# Patient Record
Sex: Female | Born: 1975 | Race: White | Hispanic: No | Smoking: Former smoker
Health system: Southern US, Community
[De-identification: ages and names within clinical notes are randomized; demographics above are authoritative.]

## PROBLEM LIST (undated history)

## (undated) DIAGNOSIS — F419 Anxiety disorder, unspecified: Secondary | ICD-10-CM

## (undated) DIAGNOSIS — E079 Disorder of thyroid, unspecified: Secondary | ICD-10-CM

## (undated) DIAGNOSIS — K219 Gastro-esophageal reflux disease without esophagitis: Secondary | ICD-10-CM

## (undated) DIAGNOSIS — E039 Hypothyroidism, unspecified: Secondary | ICD-10-CM

## (undated) HISTORY — DX: Gastro-esophageal reflux disease without esophagitis: K21.9

## (undated) HISTORY — PX: CHOLECYSTECTOMY: SHX55

## (undated) HISTORY — DX: Disorder of thyroid, unspecified: E07.9

---

## 1999-02-23 ENCOUNTER — Inpatient Hospital Stay (HOSPITAL_COMMUNITY): Admission: AD | Admit: 1999-02-23 | Discharge: 1999-02-23 | Payer: Self-pay | Admitting: *Deleted

## 1999-03-05 ENCOUNTER — Ambulatory Visit (HOSPITAL_COMMUNITY): Admission: RE | Admit: 1999-03-05 | Discharge: 1999-03-05 | Payer: Self-pay | Admitting: Obstetrics and Gynecology

## 1999-03-05 ENCOUNTER — Encounter: Payer: Self-pay | Admitting: Obstetrics and Gynecology

## 1999-04-04 ENCOUNTER — Other Ambulatory Visit: Admission: RE | Admit: 1999-04-04 | Discharge: 1999-04-04 | Payer: Self-pay | Admitting: *Deleted

## 1999-08-06 ENCOUNTER — Inpatient Hospital Stay (HOSPITAL_COMMUNITY): Admission: AD | Admit: 1999-08-06 | Discharge: 1999-08-06 | Payer: Self-pay | Admitting: *Deleted

## 1999-10-27 ENCOUNTER — Encounter (INDEPENDENT_AMBULATORY_CARE_PROVIDER_SITE_OTHER): Payer: Self-pay | Admitting: Specialist

## 1999-10-27 ENCOUNTER — Inpatient Hospital Stay (HOSPITAL_COMMUNITY): Admission: AD | Admit: 1999-10-27 | Discharge: 1999-10-29 | Payer: Self-pay | Admitting: Obstetrics & Gynecology

## 1999-10-30 ENCOUNTER — Encounter: Admission: RE | Admit: 1999-10-30 | Discharge: 2000-01-28 | Payer: Self-pay | Admitting: *Deleted

## 2000-01-30 ENCOUNTER — Encounter: Admission: RE | Admit: 2000-01-30 | Discharge: 2000-04-29 | Payer: Self-pay | Admitting: *Deleted

## 2000-03-13 ENCOUNTER — Other Ambulatory Visit: Admission: RE | Admit: 2000-03-13 | Discharge: 2000-03-13 | Payer: Self-pay | Admitting: *Deleted

## 2000-05-29 ENCOUNTER — Encounter: Admission: RE | Admit: 2000-05-29 | Discharge: 2000-07-08 | Payer: Self-pay | Admitting: Obstetrics and Gynecology

## 2001-03-23 ENCOUNTER — Other Ambulatory Visit: Admission: RE | Admit: 2001-03-23 | Discharge: 2001-03-23 | Payer: Self-pay | Admitting: Obstetrics and Gynecology

## 2001-05-05 ENCOUNTER — Encounter: Admission: RE | Admit: 2001-05-05 | Discharge: 2001-05-05 | Payer: Self-pay | Admitting: Orthopedic Surgery

## 2001-05-05 ENCOUNTER — Encounter: Payer: Self-pay | Admitting: Orthopedic Surgery

## 2002-04-05 ENCOUNTER — Other Ambulatory Visit: Admission: RE | Admit: 2002-04-05 | Discharge: 2002-04-05 | Payer: Self-pay | Admitting: Obstetrics and Gynecology

## 2002-08-20 ENCOUNTER — Emergency Department (HOSPITAL_COMMUNITY): Admission: EM | Admit: 2002-08-20 | Discharge: 2002-08-20 | Payer: Self-pay | Admitting: Emergency Medicine

## 2003-03-01 ENCOUNTER — Emergency Department (HOSPITAL_COMMUNITY): Admission: EM | Admit: 2003-03-01 | Discharge: 2003-03-01 | Payer: Self-pay | Admitting: Emergency Medicine

## 2003-04-19 ENCOUNTER — Other Ambulatory Visit: Admission: RE | Admit: 2003-04-19 | Discharge: 2003-04-19 | Payer: Self-pay | Admitting: Obstetrics and Gynecology

## 2004-05-22 ENCOUNTER — Other Ambulatory Visit: Admission: RE | Admit: 2004-05-22 | Discharge: 2004-05-22 | Payer: Self-pay | Admitting: Obstetrics and Gynecology

## 2005-05-23 ENCOUNTER — Other Ambulatory Visit: Admission: RE | Admit: 2005-05-23 | Discharge: 2005-05-23 | Payer: Self-pay | Admitting: Obstetrics and Gynecology

## 2006-03-31 ENCOUNTER — Other Ambulatory Visit: Admission: RE | Admit: 2006-03-31 | Discharge: 2006-03-31 | Payer: Self-pay | Admitting: Obstetrics and Gynecology

## 2009-12-09 HISTORY — PX: TUBAL LIGATION: SHX77

## 2010-05-10 ENCOUNTER — Ambulatory Visit (HOSPITAL_COMMUNITY): Admission: RE | Admit: 2010-05-10 | Discharge: 2010-05-10 | Payer: Self-pay | Admitting: Obstetrics and Gynecology

## 2010-09-29 ENCOUNTER — Ambulatory Visit (HOSPITAL_COMMUNITY): Admission: RE | Admit: 2010-09-29 | Discharge: 2010-09-29 | Payer: Self-pay | Admitting: Obstetrics and Gynecology

## 2010-12-03 ENCOUNTER — Inpatient Hospital Stay (HOSPITAL_COMMUNITY)
Admission: AD | Admit: 2010-12-03 | Discharge: 2010-12-06 | Payer: Self-pay | Source: Home / Self Care | Attending: Obstetrics and Gynecology | Admitting: Obstetrics and Gynecology

## 2010-12-04 ENCOUNTER — Encounter (INDEPENDENT_AMBULATORY_CARE_PROVIDER_SITE_OTHER): Payer: Self-pay | Admitting: Obstetrics and Gynecology

## 2011-02-18 LAB — CBC
HCT: 32.8 % — ABNORMAL LOW (ref 36.0–46.0)
HCT: 39.2 % (ref 36.0–46.0)
Hemoglobin: 11 g/dL — ABNORMAL LOW (ref 12.0–15.0)
Hemoglobin: 13.8 g/dL (ref 12.0–15.0)
MCH: 31.2 pg (ref 26.0–34.0)
MCH: 31.6 pg (ref 26.0–34.0)
MCHC: 34.1 g/dL (ref 30.0–36.0)
MCHC: 35.2 g/dL (ref 30.0–36.0)
MCV: 89.7 fL (ref 78.0–100.0)
MCV: 91.4 fL (ref 78.0–100.0)
Platelets: 163 10*3/uL (ref 150–400)
Platelets: 228 10*3/uL (ref 150–400)
RBC: 3.59 MIL/uL — ABNORMAL LOW (ref 3.87–5.11)
RBC: 4.37 MIL/uL (ref 3.87–5.11)
RDW: 12.8 % (ref 11.5–15.5)
RDW: 13 % (ref 11.5–15.5)
WBC: 14.2 10*3/uL — ABNORMAL HIGH (ref 4.0–10.5)
WBC: 9.7 10*3/uL (ref 4.0–10.5)

## 2011-02-18 LAB — RH IMMUNE GLOB WKUP(>/=20WKS)(NOT WOMEN'S HOSP)
Fetal Screen: NEGATIVE
Unit division: 0

## 2011-02-18 LAB — RPR: RPR Ser Ql: NONREACTIVE

## 2011-02-20 LAB — RH IMMUNE GLOBULIN WORKUP (NOT WOMEN'S HOSP): Unit division: 0

## 2011-02-25 LAB — RH IMMUNE GLOBULIN WORKUP (NOT WOMEN'S HOSP)
ABO/RH(D): O NEG
Antibody Screen: NEGATIVE

## 2011-04-26 NOTE — H&P (Signed)
[facility]  Patient:    BATSHEVA STEVICK                       MRN: [id number] Adm. Date:  [id number] Attending:  Cleatrice Burke Dictator:   Erin Sons, C.N.M.                         History and Physical  HISTORY OF PRESENT ILLNESS:   Ms. Julien Girt is a 35 year old, gravida 2, para 0-0-1-0, at 39-6/7 weeks who presents with spontaneous rupture of membranes approximately 10:30 p.m. last night with clear fluid noted and regular contractions since that time.  Pregnancy has been remarkable for:  1) First trimester spotting. 2) Rh negative.  3) Pyelonephritis history.  4) Rh antibody screen secondary to  RhoGAM.  PRENATAL LABORATORY DATA:     Blood type is O negative, rh antibody titer was positive.  Toxoplasmosis titers were negative.  Syphilis test was negative, hepatitis test was negative, HIV test was negative, rubella titer was immune. C and Chlamydia cultures were negative.  Pap was normal.  Glucose Challenge was normal.  AFP was normal.  EDC of October 28, 1999, was established by last menstrual period and was in agreement with ultrasound at approximately six weeks. Group B Strep culture was negative at 36 weeks.  Hemoglobin upon entry into practice was 13.5.  It was 12.6 at 28 weeks.  HISTORY OF PRESENT PREGNANCY: The patient entered care at approximately 10 weeks. She had an ultrasound at 19 weeks which documented normal growth and EDC of October 28, 1999.  She did have RhoGAM in the first trimester secondary to an episode of spotting.  She had a UTI at 28 weeks which was treated with Macrobid. She received repeat RhoGAM at 28 weeks.  She did have some carpal tunnel during  this pregnancy.  She had bacterial vaginosis at 32 weeks and was treated for metronidazole.  She had some pink discharge after intercourse at 34 weeks, negative Beta Strep was noted.  She did have some blood in her stool that was probably related to an  internal hemorrhoid.  The rest of her pregnancy was uncomplicated.  PAST OBSTETRIC HISTORY:       In 1990 the patient had a therapeutic termination of pregnancy at 10-1/2 weeks without complications.  She did receive RhoGAM.  PAST MEDICAL HISTORY:         She was a previous condom user.  She was on birth  control pills, Ortho-Tricyclen approximately eight years ago.  She did have Chlamydia and Trichomonas in the past which were both treated.  She has a yeast  infection in the past.  She reports the usual childhood diseases.  She does have a pet cat.  She had pyelonephritis at age 75.  She had a UTI approximately seven years ago.  ALLERGIES:                    No known drug allergies.  FAMILY HISTORY:               Her paternal grandfather had heart disease.  Her mother had hepatitis.  Her mother also has fibromyalgia.  Her maternal grandmother had cervical cancer.  The patients father was addicted to alcohol.  GENETIC HISTORY:              Unremarkable.  SOCIAL HISTORY:  The patient is single.  The father of the baby is  involved and supportive.  His name is Bernadetta Roell.  She is Caucasian of the  Protestant faith.  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 Her husband has his GED and two years of college and is employed as a Financial risk analyst.  She has been followed by the certified nurse midwife service of [facility] and Gynecology.  She denies any alcohol, drug, or tobacco use during this pregnancy.  PHYSICAL EXAMINATION:  VITAL SIGNS:                  Stable.  The patient is afebrile.  HEENT:                        Within normal limits.  LUNGS:                        Bilateral breath sounds are clear.  HEART:                        Regular rate and rhythm without murmur.  BREASTS:                      Soft and nontender.  ABDOMEN:                      Fundal height is approximately 38 cm.  Estimated fetal weight 7-1/2 to 8  pounds.  Uterine contractions are every four to six minutes, mild to moderate quality.  PELVIC:                       Cervical examination was deferred at present. She was 2 cm and vertex earlier this week in the office.  She is leaking a moderate  amount of clear fluid.  Fetal heart rate is reactive with no decellerations.  EXTREMITIES:                  Deep tendon reflexes are 2+ without clonus. There is a trace edema noted.  IMPRESSION:                   1. Intrauterine pregnancy at 39-6/7 weeks.                               2. Spontaneous rupture of membranes.                               3. Early labor.                               4. Negative Group B Strep.  PLAN:                         1) Admit to birthing suite for consult with Cecilio Asper, M.D. as attending physician.  2) Routine certified nurse midwife  orders.  3) Reviewed option with patient and her husband for initiation of Pitocin augmentation now versus deferral of augmentation until approximately 12 hours after spontaneous rupture of membranes.  The patient and her husband currently wish to defer this at present and reevaluate approximately 12 to 38  hours after rupture. 4) Will plan Group B Strep prophylaxis beginning approximately 14 hours after spontaneous rupture of membranes. DD:  10/27/99 TD:  10/28/99 Job: [id number]

## 2012-03-03 ENCOUNTER — Encounter: Payer: Self-pay | Admitting: Obstetrics and Gynecology

## 2012-03-16 ENCOUNTER — Encounter (INDEPENDENT_AMBULATORY_CARE_PROVIDER_SITE_OTHER): Payer: 59 | Admitting: Obstetrics and Gynecology

## 2012-03-16 DIAGNOSIS — Z01419 Encounter for gynecological examination (general) (routine) without abnormal findings: Secondary | ICD-10-CM

## 2012-06-10 ENCOUNTER — Other Ambulatory Visit: Payer: Self-pay

## 2014-07-06 ENCOUNTER — Encounter: Payer: Self-pay | Admitting: Internal Medicine

## 2015-11-17 ENCOUNTER — Other Ambulatory Visit: Payer: Self-pay | Admitting: Podiatry

## 2015-11-20 ENCOUNTER — Other Ambulatory Visit: Payer: Self-pay | Admitting: Podiatry

## 2015-11-20 DIAGNOSIS — M76822 Posterior tibial tendinitis, left leg: Secondary | ICD-10-CM

## 2015-11-30 ENCOUNTER — Other Ambulatory Visit: Payer: Self-pay

## 2015-12-05 ENCOUNTER — Ambulatory Visit
Admission: RE | Admit: 2015-12-05 | Discharge: 2015-12-05 | Disposition: A | Discharge status: Home / Self Care | Payer: 59 | Source: Ambulatory Visit | Attending: Podiatry | Admitting: Podiatry

## 2015-12-05 DIAGNOSIS — M76822 Posterior tibial tendinitis, left leg: Secondary | ICD-10-CM

## 2015-12-05 MED ORDER — GADOBENATE DIMEGLUMINE 529 MG/ML IV SOLN
20.0000 mL | Freq: Once | INTRAVENOUS | Status: AC | PRN
  Administered 2015-12-05: 20 mL via INTRAVENOUS

## 2017-04-23 ENCOUNTER — Other Ambulatory Visit: Payer: Self-pay | Admitting: Obstetrics and Gynecology

## 2017-04-23 DIAGNOSIS — R928 Other abnormal and inconclusive findings on diagnostic imaging of breast: Secondary | ICD-10-CM

## 2017-04-25 ENCOUNTER — Ambulatory Visit
Admission: RE | Admit: 2017-04-25 | Discharge: 2017-04-25 | Disposition: A | Discharge status: Home / Self Care | Payer: 59 | Source: Ambulatory Visit | Attending: Obstetrics and Gynecology | Admitting: Obstetrics and Gynecology

## 2017-04-25 ENCOUNTER — Other Ambulatory Visit: Payer: Self-pay | Admitting: Obstetrics and Gynecology

## 2017-04-25 DIAGNOSIS — R928 Other abnormal and inconclusive findings on diagnostic imaging of breast: Secondary | ICD-10-CM

## 2017-11-27 ENCOUNTER — Other Ambulatory Visit: Payer: Self-pay | Admitting: Obstetrics and Gynecology

## 2017-11-27 DIAGNOSIS — R921 Mammographic calcification found on diagnostic imaging of breast: Secondary | ICD-10-CM

## 2017-12-23 ENCOUNTER — Ambulatory Visit
Admission: RE | Admit: 2017-12-23 | Discharge: 2017-12-23 | Disposition: A | Discharge status: Home / Self Care | Payer: 59 | Source: Ambulatory Visit | Attending: Obstetrics and Gynecology | Admitting: Obstetrics and Gynecology

## 2017-12-23 ENCOUNTER — Other Ambulatory Visit: Payer: Self-pay | Admitting: Obstetrics and Gynecology

## 2017-12-23 DIAGNOSIS — N6310 Unspecified lump in the right breast, unspecified quadrant: Secondary | ICD-10-CM

## 2017-12-23 DIAGNOSIS — R921 Mammographic calcification found on diagnostic imaging of breast: Secondary | ICD-10-CM

## 2017-12-29 ENCOUNTER — Ambulatory Visit
Admission: RE | Admit: 2017-12-29 | Discharge: 2017-12-29 | Disposition: A | Discharge status: Home / Self Care | Payer: 59 | Source: Ambulatory Visit | Attending: Obstetrics and Gynecology | Admitting: Obstetrics and Gynecology

## 2017-12-29 ENCOUNTER — Other Ambulatory Visit: Payer: Self-pay | Admitting: Obstetrics and Gynecology

## 2017-12-29 DIAGNOSIS — R921 Mammographic calcification found on diagnostic imaging of breast: Secondary | ICD-10-CM

## 2017-12-29 DIAGNOSIS — N6310 Unspecified lump in the right breast, unspecified quadrant: Secondary | ICD-10-CM

## 2018-07-17 ENCOUNTER — Other Ambulatory Visit: Payer: Self-pay | Admitting: Obstetrics and Gynecology

## 2018-07-17 DIAGNOSIS — R921 Mammographic calcification found on diagnostic imaging of breast: Secondary | ICD-10-CM

## 2018-08-06 ENCOUNTER — Encounter: Payer: Self-pay | Admitting: Physician Assistant

## 2018-08-06 ENCOUNTER — Encounter (INDEPENDENT_AMBULATORY_CARE_PROVIDER_SITE_OTHER): Payer: Self-pay

## 2018-08-06 ENCOUNTER — Other Ambulatory Visit (INDEPENDENT_AMBULATORY_CARE_PROVIDER_SITE_OTHER): Payer: 59

## 2018-08-06 ENCOUNTER — Ambulatory Visit (INDEPENDENT_AMBULATORY_CARE_PROVIDER_SITE_OTHER): Payer: 59 | Admitting: Physician Assistant

## 2018-08-06 VITALS — BP 100/78 | HR 86 | Ht 67.0 in | Wt 221.0 lb

## 2018-08-06 DIAGNOSIS — R112 Nausea with vomiting, unspecified: Secondary | ICD-10-CM

## 2018-08-06 DIAGNOSIS — R1010 Upper abdominal pain, unspecified: Secondary | ICD-10-CM | POA: Diagnosis not present

## 2018-08-06 DIAGNOSIS — K219 Gastro-esophageal reflux disease without esophagitis: Secondary | ICD-10-CM

## 2018-08-06 LAB — COMPREHENSIVE METABOLIC PANEL
ALK PHOS: 57 U/L (ref 39–117)
ALT: 15 U/L (ref 0–35)
AST: 12 U/L (ref 0–37)
Albumin: 4 g/dL (ref 3.5–5.2)
BUN: 12 mg/dL (ref 6–23)
CHLORIDE: 105 meq/L (ref 96–112)
CO2: 29 meq/L (ref 19–32)
Calcium: 9.1 mg/dL (ref 8.4–10.5)
Creatinine, Ser: 0.86 mg/dL (ref 0.40–1.20)
GFR: 76.51 mL/min (ref >60.00)
GLUCOSE: 108 mg/dL — AB (ref 70–99)
Potassium: 4.3 mEq/L (ref 3.5–5.1)
SODIUM: 139 meq/L (ref 135–145)
Total Bilirubin: 0.8 mg/dL (ref 0.2–1.2)
Total Protein: 6.7 g/dL (ref 6.0–8.3)

## 2018-08-06 LAB — CBC WITH DIFFERENTIAL/PLATELET
Basophils Absolute: 0.1 10*3/uL (ref 0.0–0.1)
Basophils Relative: 1.3 % (ref 0.0–3.0)
Eosinophils Absolute: 0.3 10*3/uL (ref 0.0–0.7)
Eosinophils Relative: 4 % (ref 0.0–5.0)
HCT: 34.2 % — ABNORMAL LOW (ref 36.0–46.0)
Hemoglobin: 12.1 g/dL (ref 12.0–15.0)
LYMPHS ABS: 1.9 10*3/uL (ref 0.7–4.0)
Lymphocytes Relative: 30.4 % (ref 12.0–46.0)
MCHC: 35.2 g/dL (ref 30.0–36.0)
MCV: 92.2 fl (ref 78.0–100.0)
MONOS PCT: 8.4 % (ref 3.0–12.0)
Monocytes Absolute: 0.5 10*3/uL (ref 0.1–1.0)
NEUTROS ABS: 3.5 10*3/uL (ref 1.4–7.7)
NEUTROS PCT: 55.9 % (ref 43.0–77.0)
Platelets: 225 10*3/uL (ref 150.0–400.0)
RBC: 3.71 Mil/uL — AB (ref 3.87–5.11)
RDW: 12.7 % (ref 11.5–15.5)
WBC: 6.3 10*3/uL (ref 4.0–10.5)

## 2018-08-06 LAB — LIPASE: LIPASE: 23 U/L (ref 11.0–59.0)

## 2018-08-06 MED ORDER — PANTOPRAZOLE SODIUM 40 MG PO TBEC: 40.0000 mg | DELAYED_RELEASE_TABLET | Freq: Two times a day (BID) | ORAL | 3 refills | Status: DC

## 2018-08-06 NOTE — Patient Instructions (Signed)
Your provider has requested that you go to the basement level for lab work before leaving today. Press "B" on the elevator. The lab is located at the first door on the left as you exit the elevator.  You have been scheduled for an abdominal ultrasound at [facility] (1st floor of hospital) on 08/13/18 at 10:30 am. Please arrive 15 minutes prior to your appointment for registration. Make certain not to have anything to eat or drink 6 hours prior to your appointment. Should you need to reschedule your appointment, please contact radiology at [phone]. This test typically takes about 30 minutes to perform.  We have sent the following medications to your pharmacy for you to pick up at your convenience: INCREASE Pantoprazole to 40 mg twice daily.  Thank you for choosing me and [facility].   Ellouise Newer, PA-C

## 2018-08-06 NOTE — Progress Notes (Signed)
Chief Complaint: GERD  HPI:    Mrs. Valerie Butler is a 42 year old female with a past medical history as listed below, who was referred to me by Jefm Petty, MD for a complaint of GERD .      10/06/2007 EGD with Dr. Henrene Pastor which showed patulous GE junction and was otherwise normal.    06/25/2018 saw PCP and discussed increase in reflux she was prescribed Protonix 40 mg daily at that time.    Today, explains that she is having the same sort of episodes that she was about 11 years ago when she was first seen in our clinic and had an EGD.  Tells me that over the past few months she has had episodes where she will eat something that "does not agree with me" such as greasy things or beer and about 2 hours later her stomach will feel as though it hardens and her "sphincter flip-flops" she will develop a severe chest pain and pain into her back that lasts 4 to 5 hours.  During this time the patient has violent vomiting and when she finally feels depleted of food the pain and flare will be over.  Was started on Pantoprazole 40 mg twice daily by her PCP a month and a half ago and this did "calm things down", but now she continues with reflux symptoms telling me that she tastes acid in her mouth on a daily basis.  She did back down to 1 a day Pantoprazole about a week or 2 ago.  Associated symptoms include some weight loss of about 5 to 10 pounds.    Denies fever, chills, anorexia, change in bowel habits or symptoms that awaken her from sleep.  Past Medical History:  Diagnosis Date  . GERD (gastroesophageal reflux disease)   . Thyroid disease     Surgical History: No previous surgeries  Current Outpatient Medications  Medication Sig Dispense Refill  . levothyroxine (SYNTHROID, LEVOTHROID) 112 MCG tablet Take 1 tablet by mouth daily.    . Multiple Vitamin (MULTIVITAMIN WITH MINERALS) TABS tablet Take 1 tablet by mouth daily.    . pantoprazole (PROTONIX) 40 MG tablet Take 1 tablet by mouth daily.     No  current facility-administered medications for this visit.     Allergies as of 08/06/2018  . (Not on File)    Family History  Problem Relation Age of Onset  . Rectal cancer Father   . Ovarian cancer Maternal Grandmother   . Colon cancer Neg Hx   . Esophageal cancer Neg Hx   . Pancreatic cancer Neg Hx     Social History   Socioeconomic History  . Marital status: Legally Separated    Spouse name: Not on file  . Number of children: 2  . Years of education: Not on file  . Highest education level: Not on file  Occupational History  . Occupation: Freight forwarder  Social Needs  . Financial resource strain: Not on file  . Food insecurity:    Worry: Not on file    Inability: Not on file  . Transportation needs:    Medical: Not on file    Non-medical: Not on file  Tobacco Use  . Smoking status: Former Research scientist (life sciences)  . Smokeless tobacco: Never Used  Substance and Sexual Activity  . Alcohol use: Yes    Alcohol/week: 1.0 standard drinks    Types: 1 Glasses of wine per week  . Drug use: Never  . Sexual activity: Not on file  Lifestyle  .  Physical activity:    Days per week: Not on file    Minutes per session: Not on file  . Stress: Not on file  Relationships  . Social connections:    Talks on phone: Not on file    Gets together: Not on file    Attends religious service: Not on file    Active member of club or organization: Not on file    Attends meetings of clubs or organizations: Not on file    Relationship status: Not on file  . Intimate partner violence:    Fear of current or ex partner: Not on file    Emotionally abused: Not on file    Physically abused: Not on file    Forced sexual activity: Not on file  Other Topics Concern  . Not on file  Social History Narrative  . Not on file    Review of Systems:    Constitutional: No weight loss, fever or chills Skin: No rash  Cardiovascular: No chest pain Respiratory: No SOB  Gastrointestinal: See HPI and otherwise  negative Genitourinary: No dysuria Neurological: No headache, dizziness or syncope Musculoskeletal: No new muscle or joint pain Hematologic: No bleeding  Psychiatric: No history of depression or anxiety   Physical Exam:  Vital signs: BP 100/78   Pulse 86   Ht 5\' 7"  (1.702 m)   Wt 221 lb (100.2 kg)   BMI 34.61 kg/m   Constitutional:   Pleasant overweight Caucasian female appears to be in NAD, Well developed, Well nourished, alert and cooperative Head:  Normocephalic and atraumatic. Eyes:   PEERL, EOMI. No icterus. Conjunctiva pink. Ears:  Normal auditory acuity. Neck:  Supple Throat: Oral cavity and pharynx without inflammation, swelling or lesion.  Respiratory: Respirations even and unlabored. Lungs clear to auscultation bilaterally.   No wheezes, crackles, or rhonchi.  Cardiovascular: Normal S1, S2. No MRG. Regular rate and rhythm. No peripheral edema, cyanosis or pallor.  Gastrointestinal:  Soft, nondistended, nontender. No rebound or guarding. Normal bowel sounds. No appreciable masses or hepatomegaly. Rectal:  Not performed.  Msk:  Symmetrical without gross deformities. Without edema, no deformity or joint abnormality.  Neurologic:  Alert and  oriented x4;  grossly normal neurologically.  Skin:   Dry and intact without significant lesions or rashes. Psychiatric: Demonstrates good judgement and reason without abnormal affect or behaviors.  No recent labs or imaging.  Assessment: 1.  Abdominal pain: Mostly epigastric pain which comes in flares about 2 hours after eating greasy food or drinking beer, EGD 11 years ago for the same symptoms was normal; consider gallbladder etiology +/- bile reflux versus gastric origin 2.  GERD: Daily, some better with Pantoprazole 40 mg 3.  Nausea and vomiting: With flares of abdominal pain; consider gallbladder origin  Plan: 1.  Scheduled patient for right upper quadrant ultrasound for further evaluation of her gallbladder.  If this is  normal/negative would recommend HIDA scan with CCK. 2.  Ordered labs to include CBC, CMP and lipase. 3.  Recommend patient increase her Pantoprazole to twice daily again 40 mg twice daily, 30-60 minutes for eating breakfast and dinner #60 with 3 refills 4.  Discussed with patient she continues with reflux symptoms or pain we could add Carafate, recommend Carafate 1 g 4 times daily 20 to 30 minutes before meals if needed. 5.  Patient to follow in clinic per recommendations after testing above.  Ellouise Newer, PA-C [facility] 08/06/2018, 9:57 AM  Cc: Jefm Petty, MD

## 2018-08-06 NOTE — Progress Notes (Signed)
Assessment and plan reviewed 

## 2018-08-07 ENCOUNTER — Ambulatory Visit
Admission: RE | Admit: 2018-08-07 | Discharge: 2018-08-07 | Disposition: A | Discharge status: Home / Self Care | Payer: 59 | Source: Ambulatory Visit | Attending: Obstetrics and Gynecology | Admitting: Obstetrics and Gynecology

## 2018-08-07 ENCOUNTER — Other Ambulatory Visit: Payer: Self-pay | Admitting: Obstetrics and Gynecology

## 2018-08-07 DIAGNOSIS — R921 Mammographic calcification found on diagnostic imaging of breast: Secondary | ICD-10-CM

## 2018-08-13 ENCOUNTER — Ambulatory Visit (HOSPITAL_COMMUNITY)
Admission: RE | Admit: 2018-08-13 | Discharge: 2018-08-13 | Disposition: A | Discharge status: Home / Self Care | Payer: 59 | Source: Ambulatory Visit | Attending: Physician Assistant | Admitting: Physician Assistant

## 2018-08-13 DIAGNOSIS — K8020 Calculus of gallbladder without cholecystitis without obstruction: Secondary | ICD-10-CM | POA: Insufficient documentation

## 2018-08-13 DIAGNOSIS — R1010 Upper abdominal pain, unspecified: Secondary | ICD-10-CM | POA: Insufficient documentation

## 2018-08-13 DIAGNOSIS — R112 Nausea with vomiting, unspecified: Secondary | ICD-10-CM | POA: Insufficient documentation

## 2018-08-18 ENCOUNTER — Telehealth: Payer: Self-pay | Admitting: Physician Assistant

## 2018-08-18 NOTE — Telephone Encounter (Signed)
I spoke with Valerie Butler at [facility] and she will call the pt and get her scheduled today.

## 2018-09-18 ENCOUNTER — Other Ambulatory Visit: Payer: Self-pay | Admitting: General Surgery

## 2020-01-13 ENCOUNTER — Other Ambulatory Visit: Payer: Self-pay | Admitting: Family Medicine

## 2020-01-13 DIAGNOSIS — Z1231 Encounter for screening mammogram for malignant neoplasm of breast: Secondary | ICD-10-CM

## 2020-01-19 ENCOUNTER — Other Ambulatory Visit: Payer: Self-pay

## 2020-01-19 ENCOUNTER — Ambulatory Visit
Admission: RE | Admit: 2020-01-19 | Discharge: 2020-01-19 | Disposition: A | Discharge status: Home / Self Care | Payer: BC Managed Care – PPO | Source: Ambulatory Visit | Attending: Family Medicine | Admitting: Family Medicine

## 2020-01-19 DIAGNOSIS — Z1231 Encounter for screening mammogram for malignant neoplasm of breast: Secondary | ICD-10-CM

## 2020-02-01 ENCOUNTER — Other Ambulatory Visit: Payer: Self-pay

## 2020-02-01 ENCOUNTER — Ambulatory Visit: Payer: BC Managed Care – PPO | Admitting: Neurology

## 2020-02-01 ENCOUNTER — Encounter: Payer: Self-pay | Admitting: Neurology

## 2020-02-01 VITALS — BP 112/82 | HR 62 | Temp 97.9°F | Ht 67.0 in | Wt 232.0 lb

## 2020-02-01 DIAGNOSIS — Z6836 Body mass index (BMI) 36.0-36.9, adult: Secondary | ICD-10-CM

## 2020-02-01 DIAGNOSIS — G478 Other sleep disorders: Secondary | ICD-10-CM

## 2020-02-01 DIAGNOSIS — E0789 Other specified disorders of thyroid: Secondary | ICD-10-CM | POA: Diagnosis not present

## 2020-02-01 DIAGNOSIS — E6609 Other obesity due to excess calories: Secondary | ICD-10-CM | POA: Diagnosis not present

## 2020-02-01 DIAGNOSIS — R5383 Other fatigue: Secondary | ICD-10-CM

## 2020-02-01 DIAGNOSIS — G4719 Other hypersomnia: Secondary | ICD-10-CM

## 2020-02-01 NOTE — Patient Instructions (Signed)

## 2020-02-01 NOTE — Progress Notes (Signed)
SLEEP MEDICINE CLINIC    Provider:  Larey Seat, MD  Primary Care Physician:  Hayden Rasmussen, [place] [address]     Referring Provider: Horald Pollen, MD       Chief Complaint according to patient   Patient presents with:    . New Patient (Initial Visit)     alone, rm 10. pt states that she has had difficulty with sleep for several years. she states she has made some sleep hygiene changes that certainly helped. never had a SS. pt states that she has woke herself up snoring and has woke up with headache. states this has been more so in past year- gained 20 pounds, has lost now again 15 pounds.       HISTORY OF PRESENT ILLNESS:  Valerie Butler is a 44 y.o. year old Caucasian female patient and seen here upon a referral by Dr. Horald Pollen on 02/01/2020   Chief concern according to patient :  "sleep is non- restorative, irregular periods ,and perimenopausal changes "  I have the pleasure of seeing Valerie Butler today, a right -handed Caucasian female with a long standing  sleep disorder.  She  has a past medical history of GERD (gastroesophageal reflux disease) and Thyroid disease. She had a cholecystectomy which helped GERD symptoms.  She developed more weight gain, and now has been gasping for breath in her sleep, and developed morning  headaches.     Sleep relevant medical history: Nocturia may be once, no Tonsillectomy, no cervical spine or ENT surgery , had thyroid disease treated with radioactive Iodine. She was a sleep walker.   Family medical /sleep history: Mother has non-restorative sleep and insomnia, but nobody sleep walked beside her.    Social history:  Patient is working in an Technical sales engineer, and lives in a household with 2 persons. Family status is divorced / separated 2 with children, a son (born 11) and daughter ( 2011) .The patient currently works days but sometimes into the night - Pets are  present, 2 dogs. Tobacco use: quit 2000.  ETOH use: 1-2 drinks a week,  Caffeine intake in form of Coffee( 2 cups in AM ) Soda( 1 at lunch ) Tea ( rarely) . NO  energy drinks. Regular exercise in form of walking-  And exercises at [facility] spin classes.   Hobbies : kayak, hiking.    Sleep habits are as follows: The patient's dinner time is between 7-8 PM. The patient goes to bed at 11-12 PM and continues to sleep for  hours, but feels that she didn't sleep- wakes up feeling alert- wakes rarely for bathroom breaks. Lots of pain issues affecting sleep, hip pain- at least 2 years, some low back discomfort. .   The preferred sleep position is laterally or prone, with the support of 1 pillow.  Dreams are reportedly rare- none in years.  6.30 AM is the usual rise time. The patient wakes up spontaneously at 5 AM- awake for 1.5 hours in bed.  She reports not feeling refreshed or restored in AM, but alert. Has sometimes symptoms such as dry mouth, but frequent morning headaches, and residual fatigue.  Naps are taken frequently in WEnds, lasting from 1 Pm to 3 Pm  and aren't  more refreshing than nocturnal sleep.    Review of Systems: Out of a complete 14 system review, the patient complains of only the following symptoms, and  all other reviewed systems are negative.:  Fatigue more than sleepiness , nasal congestion, new onset snoring, unfragmented sleep- but the feeling of subjective poor quality sleep , rather than Insomnia- the feeling she hasn't slept deeply at all.    How likely are you to doze in the following situations: 0 = not likely, 1 = slight chance, 2 = moderate chance, 3 = high chance   Sitting and Reading? Watching Television? Sitting inactive in a public place (theater or meeting)? As a passenger in a car for an hour without a break? Lying down in the afternoon when circumstances permit? Sitting and talking to someone? Sitting quietly after lunch without alcohol? In a car, while  stopped for a few minutes in traffic?   Total = 12/ 24 points   FSS endorsed at 45/ 63 points.   Social History   Socioeconomic History  . Marital status: Legally Separated    Spouse name: Not on file  . Number of children: 2  . Years of education: Not on file  . Highest education level: Not on file  Occupational History  . Occupation: Freight forwarder  Tobacco Use  . Smoking status: Former Smoker    Types: Cigarettes  . Smokeless tobacco: Never Used  . Tobacco comment: quit 20 yrs ago  Substance and Sexual Activity  . Alcohol use: Yes    Alcohol/week: 1.0 standard drinks    Types: 1 Glasses of wine per week  . Drug use: Never  . Sexual activity: Not on file  Other Topics Concern  . Not on file  Social History Narrative  . Not on file   Social Determinants of Health   Financial Resource Strain:   . Difficulty of Paying Living Expenses: Not on file  Food Insecurity:   . Worried About Charity fundraiser in the Last Year: Not on file  . Ran Out of Food in the Last Year: Not on file  Transportation Needs:   . Lack of Transportation (Medical): Not on file  . Lack of Transportation (Non-Medical): Not on file  Physical Activity:   . Days of Exercise per Week: Not on file  . Minutes of Exercise per Session: Not on file  Stress:   . Feeling of Stress : Not on file  Social Connections:   . Frequency of Communication with Friends and Family: Not on file  . Frequency of Social Gatherings with Friends and Family: Not on file  . Attends Religious Services: Not on file  . Active Member of Clubs or Organizations: Not on file  . Attends Archivist Meetings: Not on file  . Marital Status: Not on file    Family History  Problem Relation Age of Onset  . Rectal cancer Father   . Ovarian cancer Maternal Grandmother   . Colon cancer Neg Hx   . Esophageal cancer Neg Hx   . Pancreatic cancer Neg Hx     Past Medical History:  Diagnosis Date  . GERD (gastroesophageal reflux  disease)   . Thyroid disease     Past Surgical History:  Procedure Laterality Date  . CHOLECYSTECTOMY    . TUBAL LIGATION  2011     Current Outpatient Medications on File Prior to Visit  Medication Sig Dispense Refill  . levothyroxine (SYNTHROID, LEVOTHROID) 112 MCG tablet Take 1 tablet by mouth daily.    . Multiple Vitamin (MULTIVITAMIN WITH MINERALS) TABS tablet Take 1 tablet by mouth daily.    Marland Kitchen NUVARING 0.12-0.015 MG/24HR vaginal ring  INSERT 1 RING VAGINALLY FOR 3 WEEKS THEN REMOVE FOR 1 WEEK     No current facility-administered medications on file prior to visit.    Physical exam:  Today's Vitals   02/01/20 0848  BP: 112/82  Pulse: 62  Temp: 97.9 F (36.6 C)  Weight: 232 lb (105.2 kg)  Height: '5\' 7"'$  (1.702 m)   Body mass index is 36.34 kg/m.   Wt Readings from Last 3 Encounters:  02/01/20 232 lb (105.2 kg)  08/06/18 221 lb (100.2 kg)     Ht Readings from Last 3 Encounters:  02/01/20 '5\' 7"'$  (1.702 m)  08/06/18 '5\' 7"'$  (1.702 m)      General: The patient is awake, alert and appears not in acute distress.  The patient is well groomed. Head: Normocephalic, atraumatic.  Neck is supple. Mallampati 3- elongated uvula touching the tongue ground. ,  neck circumference: 15.6  inches .  Nasal airflow patent.   Retrognathia is not  seen.  Valerie Butler is noted -  Cardiovascular:  Regular rate and cardiac rhythm by pulse,  without distended neck veins. Respiratory: Lungs are clear to auscultation.  Skin:  Without evidence of ankle edema, or rash. Trunk: The patient's posture is erect.   Neurologic exam : The patient is awake and alert, oriented to place and time.   Memory subjective described as intact.  Attention span & concentration ability appears normal.  Speech is fluent,  without  dysarthria, dysphonia or aphasia.  Mood and affect are appropriate.   Cranial nerves: no loss of smell or taste reported  Pupils are equal and briskly reactive to light. Funduscopic exam  deferred.   Extraocular movements in vertical and horizontal planes were intact and without nystagmus. No Diplopia. Visual fields by finger perimetry are intact.Hearing was intact to soft voice and finger rubbing.   Facial sensation intact to fine touch. Facial motor strength is symmetric and tongue and uvula move midline.  Neck ROM : rotation, tilt and flexion extension were normal for age and shoulder shrug was symmetrical.    Motor exam:  Symmetric bulk, tone and ROM.  Normal tone without cog-wheeling, symmetric grip strength .   Sensory:  Fine touch, pinprick and vibration were tested  and  normal.  Proprioception tested in the upper extremities was normal.   Coordination: Rapid alternating movements in the fingers/hands were of normal speed.  The Finger-to-nose maneuver was intact without evidence of ataxia, dysmetria or tremor.   Gait and station: Patient could rise unassisted from a seated position, walked without assistive device.  Stance is of normal width/ base and the patient was observed turned with 3 steps.  Toe and heel walk were deferred.  Deep tendon reflexes: in the  upper and lower extremities are symmetric and intact.  Babinski response was deferred.       Summary.:  It showed Valerie Butler is a 44 year old mother of 2 who feels that she never reaches deep, restorative and refreshing sleep affecting memory and she feels as if she cannot concentrate or focus as well.  She is also noticed some delayed word finding also is not evident in our interview today her recent blood tests obtained through her primary care physician show normal white blood cell count red blood cell count hemoglobin hematocrit MCV platelets, there is no abnormal differential blood count comprehensive metabolic panel was normal sodium 140 potassium 5 chloride 107 mEq/L CO2 27 mEq/L glucose 95 fasting BUN 17 mg/dL, creatinine 8.92 letter normal range, calcium 9.6  protein overall 6.8 g/dL albumin 4.2 g, alk  phos 77 international units, ALT 16 international units AST 11 international units all speaking for good kidney and hepatic function GFR estimated to be 75 mL.  Hemoglobin A1c was 5.0 no indication of diabetes or prediabetes.  Her risk factors lie with her increase nasal congestion the feeling that she has to breathe through the mouth at occasion and that she probably started snoring within the last year or so.  Her BMI has increased also she is actively resuming an exercise regimen and has already reduced her body weight by 15 pounds again.    Onset of her sleep disturbance he was with her pregnancies.    After spending a total time of 45 minutes face to face and additional time for physical and neurologic examination, review of laboratory studies,  personal review of imaging studies, reports and results of other testing and review of referral information / records as far as provided in visit, I have established the following assessments:  1) subjective sleep perception is that of poor quality sleep 2) chronic persistent fatigue , before thyroid problems arose. 3) snoring -  4) high BMI  36.3   My Plan is to proceed with:  1) screen for OSA.  2) I would prefer a sleep architecture reflecting study as attended PSG to evaluate for  Sleep perception disorder.  3) sleep hygiene discussed, cold, quiet and dark bedroom, no electronics in the bedroom, hot shower before bed.  weighted blanket.  Insomnia boot camp instructions given.   I would like to thank Hayden Rasmussen, MD for allowing me to meet with and to take care of this pleasant patient.   In short, Valerie Butler is presenting with fatigue, onset of snoring and likely some sleep perception distortion. I plan to follow up either personally or through our NP within 2-3  month.   CC: I will share my notes with PCP.   Electronically signed by: Larey Seat, MD 02/01/2020 9:01 AM  [facility] [employer] Board certified by The [employer] of Sleep Medicine and Diplomate of the [employer] of Sleep Medicine. Board certified In Neurology through the [place], Fellow of the [employer] of Neurology. Medical Director of [employer].

## 2020-02-28 ENCOUNTER — Other Ambulatory Visit: Payer: Self-pay

## 2020-02-28 ENCOUNTER — Ambulatory Visit (INDEPENDENT_AMBULATORY_CARE_PROVIDER_SITE_OTHER): Payer: BC Managed Care – PPO | Admitting: Neurology

## 2020-02-28 DIAGNOSIS — E6609 Other obesity due to excess calories: Secondary | ICD-10-CM

## 2020-02-28 DIAGNOSIS — E0789 Other specified disorders of thyroid: Secondary | ICD-10-CM

## 2020-02-28 DIAGNOSIS — G478 Other sleep disorders: Secondary | ICD-10-CM

## 2020-02-28 DIAGNOSIS — R5383 Other fatigue: Secondary | ICD-10-CM

## 2020-02-28 DIAGNOSIS — G4710 Hypersomnia, unspecified: Secondary | ICD-10-CM

## 2020-03-01 ENCOUNTER — Telehealth: Payer: Self-pay

## 2020-03-01 NOTE — Telephone Encounter (Signed)
Called patient on 02-29-20 close to 4pm and left message her home sleep study needed top be returned by 5. It was due back on 02-29-20.                                                   03-01-20 called patient at 8am and left message that we needed machine back by lunch to clean and set up on another patient. No call back. Called her emergency contact at 1330 and explained the need for this to be turned back in. She was going to relay the message.

## 2020-03-14 DIAGNOSIS — E0789 Other specified disorders of thyroid: Secondary | ICD-10-CM | POA: Insufficient documentation

## 2020-03-14 DIAGNOSIS — G478 Other sleep disorders: Secondary | ICD-10-CM | POA: Insufficient documentation

## 2020-03-14 DIAGNOSIS — R5383 Other fatigue: Secondary | ICD-10-CM | POA: Insufficient documentation

## 2020-03-14 DIAGNOSIS — E6609 Other obesity due to excess calories: Secondary | ICD-10-CM | POA: Insufficient documentation

## 2020-03-14 DIAGNOSIS — Z6836 Body mass index (BMI) 36.0-36.9, adult: Secondary | ICD-10-CM | POA: Insufficient documentation

## 2020-03-14 NOTE — Progress Notes (Signed)
Summary & Diagnosis:   No sleep apnea was found - just mild snoring. No hypoxemia was  recorded.    Recommendations:    No explanation for morning headaches found. Snoring may respond  to weight loss, dental device and better sleep may be achieved  through HRT.   Interpreting Physician: Larey Seat, MD   Cc Dr Dian Situ, MD

## 2020-03-14 NOTE — Procedures (Signed)
  Patient Information     First Name: Valerie Last Name: Butler ID: [id number]  Birth Date: 1976/05/15 Age: 44 Gender: Female  Referring Provider: Horald Pollen, MD   BMI: 36.3 (W=231 lb, H=5' 7'')  Neck Circ.:  15 '' Epworth:  12/24   Sleep Study Information    Study Date: Mar 01, 2020 S/H/A Version: 001.001.001.001 / 4.0.1515 / 2  History:    Valerie Butler is a 44 y.o. year old Caucasian female patient and seen here upon a referral by Dr. Horald Pollen on 02/01/2020.Chief concern according to patient :  "sleep is non- restorative, also having irregular periods ,and perimenopausal changes "  Valerie Butler is a right -handed Caucasian female with a long standing  sleep disorder.  She has a past medical history of GERD (gastroesophageal reflux disease) and Thyroid disease. She had a cholecystectomy which helped GERD symptoms. She developed more weight gain, and now has been gasping for breath in her sleep, while she  developed morning headache.      Summary & Diagnosis:    No sleep apnea was found - just mild snoring. No hypoxemia was recorded.    Recommendations:     No explanation for morning headaches found. Snoring may respond to weight loss, dental device and better sleep may be achieved through HRT.   Interpreting Physician: Larey Seat, MD             Sleep Summary  Oxygen Saturation Statistics   Start Study Time: End Study Time: Total Recording Time:  9:05:25 PM 6:45:30 AM 9 h, 40 min  Total Sleep Time % REM of Sleep Time:  7 h, 51 min  21.3    Mean: 95 Minimum: 93 Maximum: 99  Mean of Desaturations Nadirs (%):   94  Oxygen Desaturation. %: 4-9 10-20 >20 Total  Events Number Total  2 100.0  0 0.0  0 0.0  2 100.0  Oxygen Saturation: <90 <=88 <85 <80 <70  Duration (minutes): Sleep % 0.0 0.0 0.0 0.0 0.0 0.0 0.0 0.0 0.0 0.0     Respiratory Indices      Total Events REM NREM All Night  pRDI:  99  pAHI:  15 ODI:  2  pAHIc:  0  % CSR: 0.0  16.5 6.1 1.2 0.0 11.7 0.8 0.0 0.0 12.7 1.9 0.3 0.0       Pulse Rate Statistics during Sleep (BPM)      Mean: 64 Minimum: 40 Maximum: 108    Indices are calculated using technically valid sleep time of 7 h, 47 min. Central-Indices are calculated using technically valid sleep time of 6 h, 56 min. pRDI/pAHI are calculated using oxi desaturations ? 3%  Body Position Statistics  Position Supine Prone Right Left Non-Supine  Sleep (min) 249.1 215.5 7.0 0.0 222.5  Sleep % 52.8 45.7 1.5 0.0 47.2  pRDI 13.9 10.0 N/A N/A 11.4  pAHI 1.5 1.7 N/A N/A 2.4  ODI 0.2 0.0 N/A N/A 0.3     Snoring Statistics Snoring Level (dB) >40 >50 >60 >70 >80 >Threshold (45)  Sleep (min) 197.1 8.5 2.2 0.2 0.0 16.7  Sleep % 41.8 1.8 0.5 0.0 0.0 3.5    Mean: 41 dB

## 2020-03-15 ENCOUNTER — Telehealth: Payer: Self-pay | Admitting: Neurology

## 2020-03-15 NOTE — Telephone Encounter (Signed)
-----   Message from Larey Seat, MD sent at 03/14/2020  4:05 PM EDT ----- Summary & Diagnosis:   No sleep apnea was found - just mild snoring. No hypoxemia was  recorded.    Recommendations:    No explanation for morning headaches found. Snoring may respond  to weight loss, dental device and better sleep may be achieved  through HRT.   Interpreting Physician: Larey Seat, MD   Cc Dr Dian Situ, MD

## 2020-03-15 NOTE — Telephone Encounter (Signed)
Called patient to discuss sleep study results. No answer at this time. LVM for the patient to call back.  Left a detailed message for the patient advising that there was no concerns of a sleep disorder present as well as no concerns of low oxygen. Advised the patient that if she had questions she could call back otherwise there was no need to follow up on the sleep clinic side.

## 2020-05-23 IMAGING — MG DIGITAL SCREENING BILAT W/ TOMO W/ CAD
8 series · 8 of 24 positions shown · non-contrast
Comparison: Previous exam(s).

CLINICAL DATA: Screening.

EXAM:
DIGITAL SCREENING BILATERAL MAMMOGRAM WITH TOMO AND CAD

[L CC synth-2D]
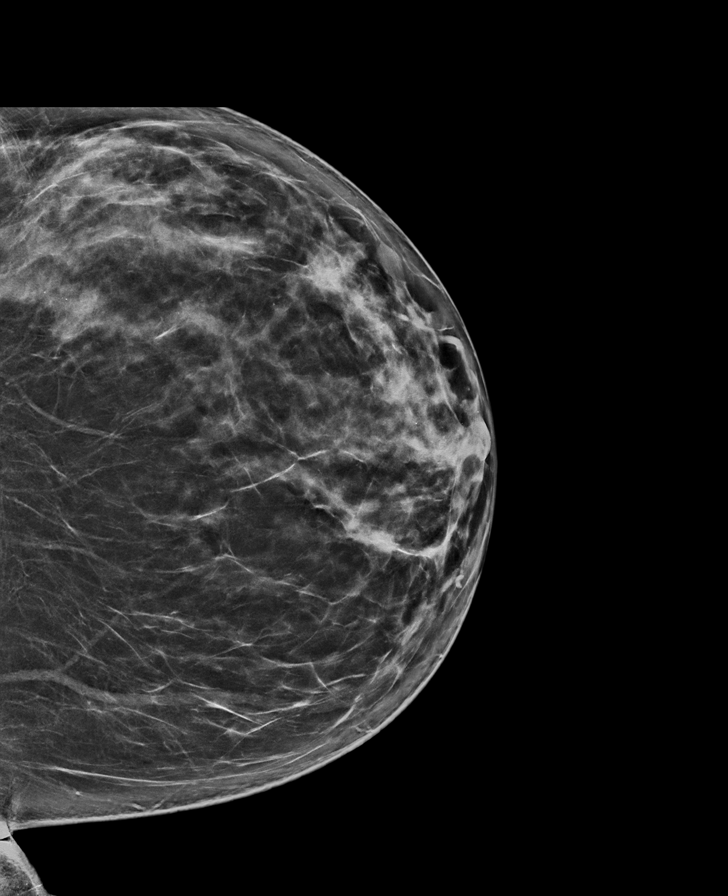

[L MLO synth-2D]
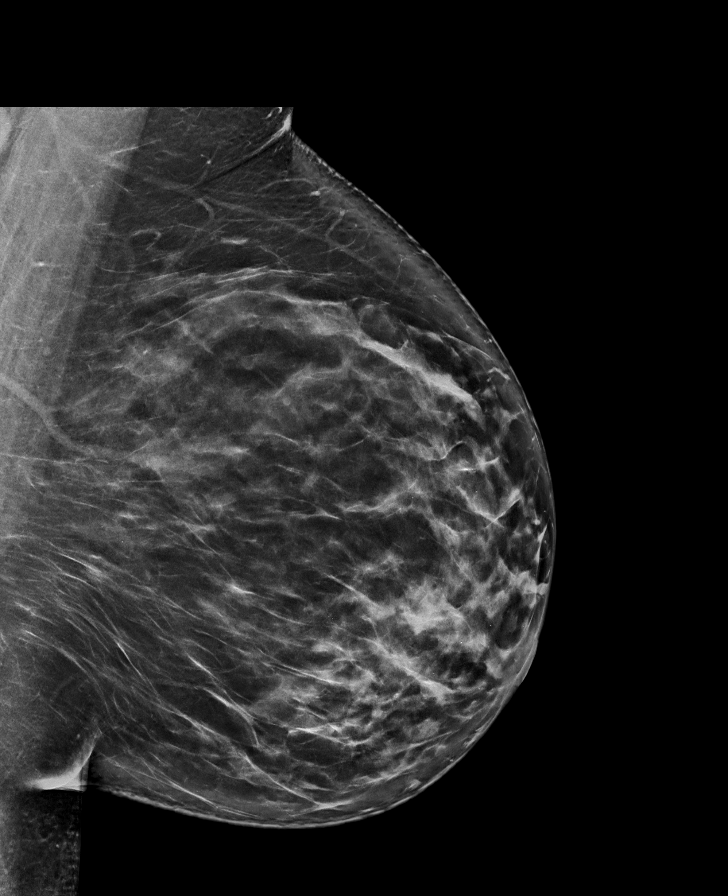

[R CC synth-2D]
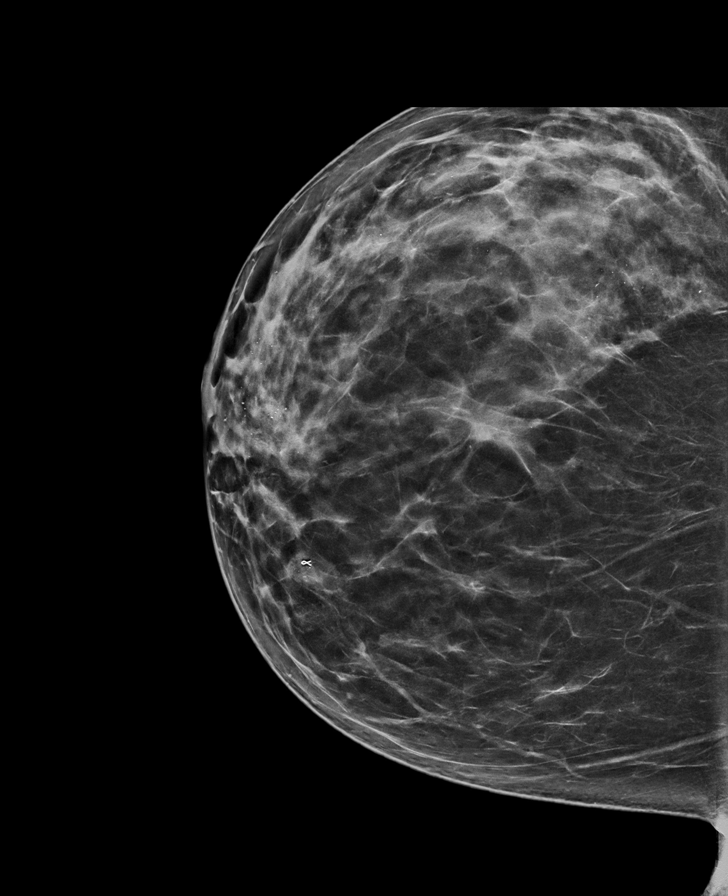

[R MLO synth-2D]
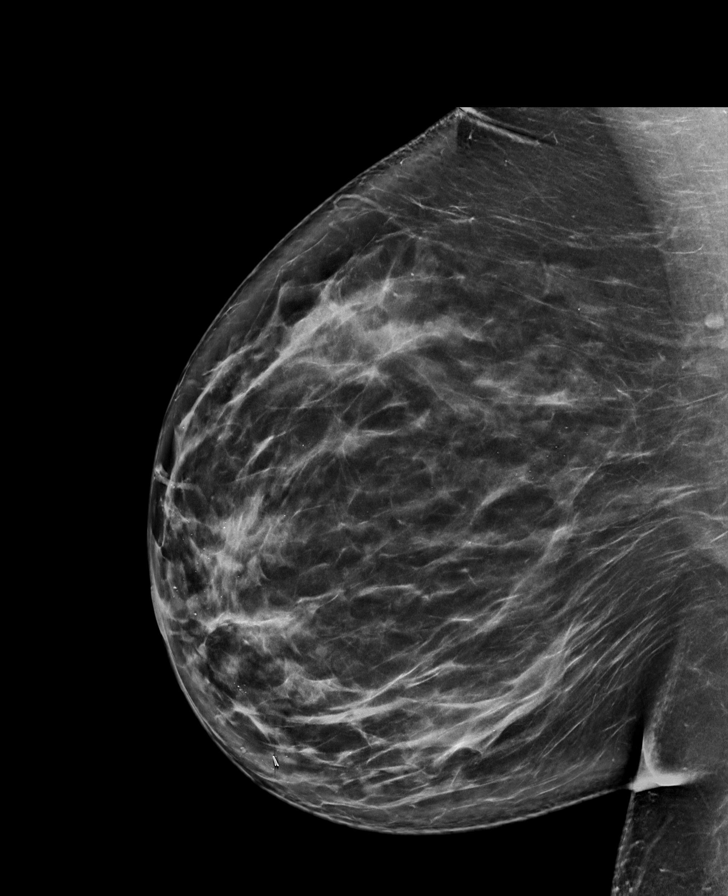

[L MLO tomo · tomo slice 49/96.0]
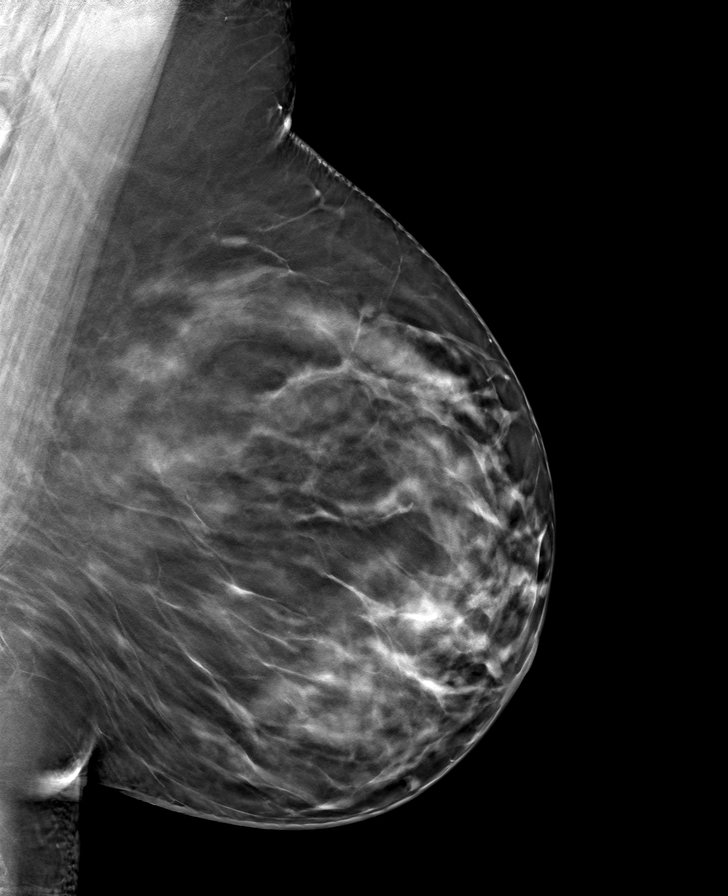

[L CC tomo · tomo slice 43/86.0]
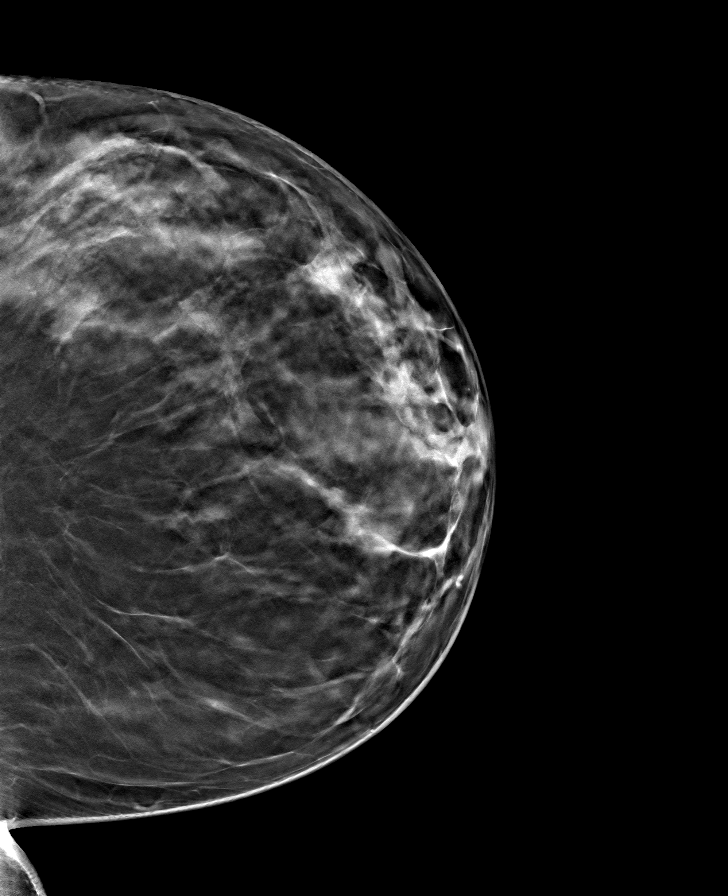

[R MLO tomo · tomo slice 50/99.0]
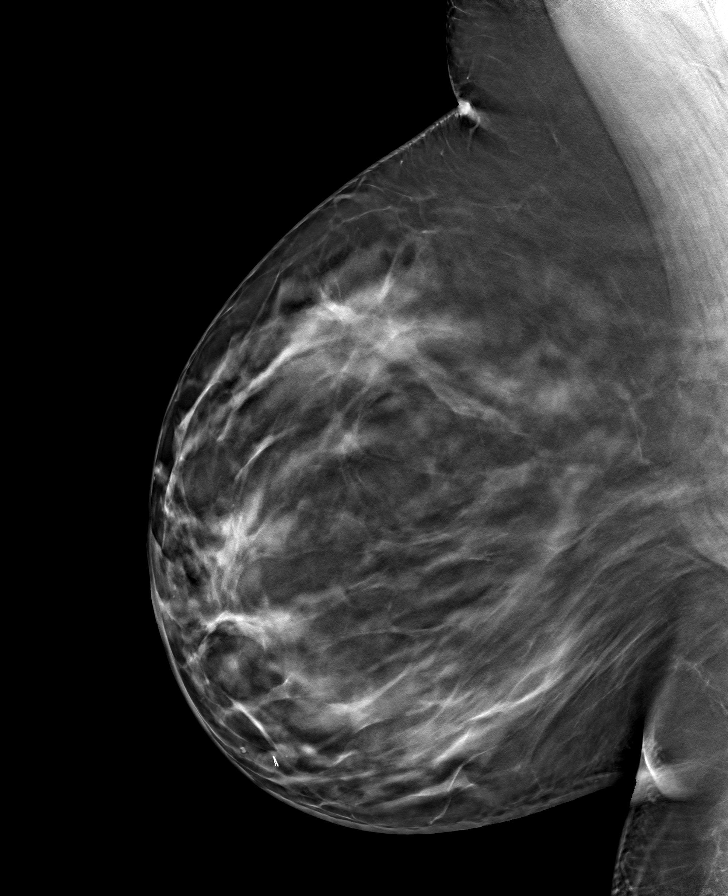

[R CC tomo · tomo slice 43/86.0]
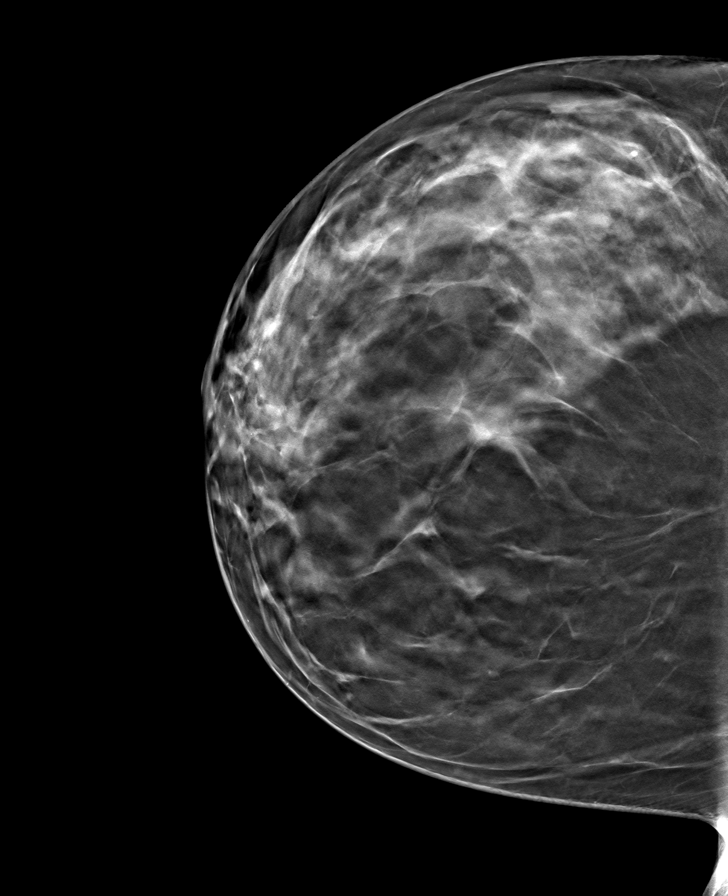

[8 of 24 positions shown; findings below may reference images not displayed]

ACR Breast Density Category c: The breast tissue is heterogeneously
dense, which may obscure small masses.
FINDINGS: There are no findings suspicious for malignancy. Images were
processed with CAD.
IMPRESSION: No mammographic evidence of malignancy. A result letter of this
screening mammogram will be mailed directly to the patient.

RECOMMENDATION:
Screening mammogram in one year. (Code:[id number]-[id number])

BI-RADS CATEGORY  1: Negative.

## 2021-08-10 ENCOUNTER — Encounter: Payer: Self-pay | Admitting: Internal Medicine

## 2021-10-26 ENCOUNTER — Ambulatory Visit (AMBULATORY_SURGERY_CENTER): Payer: BC Managed Care – PPO

## 2021-10-26 ENCOUNTER — Other Ambulatory Visit: Payer: Self-pay

## 2021-10-26 ENCOUNTER — Encounter: Payer: Self-pay | Admitting: Internal Medicine

## 2021-10-26 VITALS — Ht 67.0 in | Wt 230.0 lb

## 2021-10-26 DIAGNOSIS — Z1211 Encounter for screening for malignant neoplasm of colon: Secondary | ICD-10-CM

## 2021-10-26 MED ORDER — NA SULFATE-K SULFATE-MG SULF 17.5-3.13-1.6 GM/177ML PO SOLN: 1.0000 | Freq: Once | ORAL | 0 refills | Status: AC

## 2021-10-26 NOTE — Progress Notes (Signed)

## 2021-11-09 ENCOUNTER — Ambulatory Visit (AMBULATORY_SURGERY_CENTER): Payer: BC Managed Care – PPO | Admitting: Internal Medicine

## 2021-11-09 ENCOUNTER — Encounter: Payer: Self-pay | Admitting: Internal Medicine

## 2021-11-09 ENCOUNTER — Other Ambulatory Visit: Payer: Self-pay

## 2021-11-09 VITALS — BP 126/73 | HR 60 | Temp 98.9°F | Resp 13 | Ht 67.0 in | Wt 230.0 lb

## 2021-11-09 DIAGNOSIS — D127 Benign neoplasm of rectosigmoid junction: Secondary | ICD-10-CM

## 2021-11-09 DIAGNOSIS — Z1211 Encounter for screening for malignant neoplasm of colon: Secondary | ICD-10-CM | POA: Diagnosis not present

## 2021-11-09 DIAGNOSIS — D123 Benign neoplasm of transverse colon: Secondary | ICD-10-CM

## 2021-11-09 MED ORDER — SODIUM CHLORIDE 0.9 % IV SOLN: 500.0000 mL | Freq: Once | INTRAVENOUS | Status: DC

## 2021-11-09 NOTE — Progress Notes (Signed)
Pt's states no medical or surgical changes since previsit or office visit. VS assessed by C.W 

## 2021-11-09 NOTE — Progress Notes (Signed)
Called to room to assist during endoscopic procedure.  Patient ID and intended procedure confirmed with present staff. Received instructions for my participation in the procedure from the performing physician.  

## 2021-11-09 NOTE — Progress Notes (Signed)
HISTORY OF PRESENT ILLNESS:  Valerie Butler is a 45 y.o. female Valerie Butler today for routine screening colonoscopy.  REVIEW OF SYSTEMS:  All non-GI ROS negative. Past Medical History:  Diagnosis Date   GERD (gastroesophageal reflux disease)    Thyroid disease     Past Surgical History:  Procedure Laterality Date   CHOLECYSTECTOMY     TUBAL LIGATION  2011    Social History Valerie Butler  reports that she has quit smoking. Her smoking use included cigarettes. She has never used smokeless tobacco. She reports current alcohol use of about 1.0 standard drink per week. She reports that she does not use drugs.  family history includes Ovarian cancer in her maternal grandmother; Rectal cancer in her father.  No Known Allergies     PHYSICAL EXAMINATION:  Vital signs: BP 118/73   Pulse 63   Temp 98.9 F (37.2 C) (Skin)   Resp 13   Ht 5\' 7"  (1.702 m)   Wt 230 lb (104.3 kg)   SpO2 100%   BMI 36.02 kg/m  General: Well-developed, well-nourished, no acute distress HEENT: Sclerae are anicteric, conjunctiva pink. Oral mucosa intact Lungs: Clear Heart: Regular Abdomen: soft, nontender, nondistended, no obvious ascites, no peritoneal signs, normal bowel sounds. No organomegaly. Extremities: No edema Psychiatric: alert and oriented x3. Cooperative     ASSESSMENT:  1.  Colon cancer screening.  Average risk   PLAN:   1.  Screening colonoscopy

## 2021-11-09 NOTE — Op Note (Signed)
[facility] Patient Name: Valerie Butler Procedure Date: 11/09/2021 7:27 AM MRN: [id number] Endoscopist: Docia Chuck. Henrene Pastor , MD Age: 45 Referring MD:  Date of Birth: 09/01/1976 Gender: Female Account #: [id number] Procedure:                Colonoscopy with cold x 1 hot x 1 snare                            polypectomy; submucosal injection Indications:              Screening for colorectal malignant neoplasm Medicines:                Monitored Anesthesia Care Procedure:                Pre-Anesthesia Assessment:                           - Prior to the procedure, a History and Physical                            was performed, and patient medications and                            allergies were reviewed. The patient's tolerance of                            previous anesthesia was also reviewed. The risks                            and benefits of the procedure and the sedation                            options and risks were discussed with the patient.                            All questions were answered, and informed consent                            was obtained. Prior Anticoagulants: The patient has                            taken no previous anticoagulant or antiplatelet                            agents. ASA Grade Assessment: II - A patient with                            mild systemic disease. After reviewing the risks                            and benefits, the patient was deemed in                            satisfactory condition to undergo the procedure.  After obtaining informed consent, the colonoscope                            was passed under direct vision. Throughout the                            procedure, the patient's blood pressure, pulse, and                            oxygen saturations were monitored continuously. The                            Olympus CF-HQ190L (Serial# [id number]) Colonoscope was                             introduced through the anus and advanced to the the                            cecum, identified by appendiceal orifice and                            ileocecal valve. The ileocecal valve, appendiceal                            orifice, and rectum were photographed. The quality                            of the bowel preparation was excellent. The                            colonoscopy was performed without difficulty. The                            patient tolerated the procedure well. The bowel                            preparation used was SUPREP via split dose                            instruction. Scope In: 8:35:04 AM Scope Out: 8:54:46 AM Scope Withdrawal Time: 0 hours 16 minutes 37 seconds  Total Procedure Duration: 0 hours 19 minutes 42 seconds  Findings:                 A 3 mm polyp was found in the transverse colon. The                            polyp was removed with a cold snare. Resection and                            retrieval were complete.                           A 30 mm polyp was found in the recto-sigmoid colon                            (  20 cm from the anal verge). The polyp was                            pedunculated. The polyp was removed with a hot                            snare. Resection and retrieval were complete. Area                            was tattooed with an injection of 4 mL of Spot                            (carbon black).                           The exam was otherwise without abnormality on                            direct and retroflexion views. Complications:            No immediate complications. Estimated blood loss:                            None. Estimated Blood Loss:     Estimated blood loss: none. Impression:               - One 3 mm polyp in the transverse colon, removed                            with a cold snare. Resected and retrieved.                           - One 30 mm polyp at the recto-sigmoid colon,                             removed with a hot snare. Resected and retrieved.                            Tattooed.                           - The examination was otherwise normal on direct                            and retroflexion views. Recommendation:           - Repeat colonoscopy date to be determined after                            pending pathology results are reviewed for                            surveillance. If benign precancerous polyps, then 3  years.                           - Patient has a contact number available for                            emergencies. The signs and symptoms of potential                            delayed complications were discussed with the                            patient. Return to normal activities tomorrow.                            Written discharge instructions were provided to the                            patient.                           - Resume previous diet.                           - Continue present medications.                           - Await pathology results. Docia Chuck. Henrene Pastor, MD 11/09/2021 9:20:33 AM This report has been signed electronically.

## 2021-11-09 NOTE — Progress Notes (Signed)
Sedate, gd SR, tolerated procedure well, VSS, report to RN 

## 2021-11-09 NOTE — Patient Instructions (Signed)
Handout on polyps given. ° °YOU HAD AN ENDOSCOPIC PROCEDURE TODAY AT THE [facility]:   Refer to the procedure report that was given to you for any specific questions about what was found during the examination.  If the procedure report does not answer your questions, please call your gastroenterologist to clarify.  If you requested that your care partner not be given the details of your procedure findings, then the procedure report has been included in a sealed envelope for you to review at your convenience later. ° °YOU SHOULD EXPECT: Some feelings of bloating in the abdomen. Passage of more gas than usual.  Walking can help get rid of the air that was put into your GI tract during the procedure and reduce the bloating. If you had a lower endoscopy (such as a colonoscopy or flexible sigmoidoscopy) you may notice spotting of blood in your stool or on the toilet paper. If you underwent a bowel prep for your procedure, you may not have a normal bowel movement for a few days. ° °Please Note:  You might notice some irritation and congestion in your nose or some drainage.  This is from the oxygen used during your procedure.  There is no need for concern and it should clear up in a day or so. ° °SYMPTOMS TO REPORT IMMEDIATELY: ° °Following lower endoscopy (colonoscopy or flexible sigmoidoscopy): ° Excessive amounts of blood in the stool ° Significant tenderness or worsening of abdominal pains ° Swelling of the abdomen that is new, acute ° Fever of 100°F or higher ° °For urgent or emergent issues, a gastroenterologist can be reached at any hour by calling [phone]. °Do not use MyChart messaging for urgent concerns.  ° ° °DIET:  We do recommend a small meal at first, but then you may proceed to your regular diet.  Drink plenty of fluids but you should avoid alcoholic beverages for 24 hours. ° °ACTIVITY:  You should plan to take it easy for the rest of today and you should NOT DRIVE or use heavy machinery  until tomorrow (because of the sedation medicines used during the test).   ° °FOLLOW UP: °Our staff will call the number listed on your records 48-72 hours following your procedure to check on you and address any questions or concerns that you may have regarding the information given to you following your procedure. If we do not reach you, we will leave a message.  We will attempt to reach you two times.  During this call, we will ask if you have developed any symptoms of COVID 19. If you develop any symptoms (ie: fever, flu-like symptoms, shortness of breath, cough etc.) before then, please call [phone].  If you test positive for Covid 19 in the 2 weeks post procedure, please call and report this information to us.   ° °If any biopsies were taken you will be contacted by phone or by letter within the next 1-3 weeks.  Please call us at [phone] if you have not heard about the biopsies in 3 weeks.  ° ° °SIGNATURES/CONFIDENTIALITY: °You and/or your care partner have signed paperwork which will be entered into your electronic medical record.  These signatures attest to the fact that that the information above on your After Visit Summary has been reviewed and is understood.  Full responsibility of the confidentiality of this discharge information lies with you and/or your care-partner.  °

## 2021-11-13 ENCOUNTER — Telehealth: Payer: Self-pay

## 2021-11-13 NOTE — Telephone Encounter (Signed)
Patient returned call. States she is feeling great and returned to work

## 2021-11-13 NOTE — Telephone Encounter (Signed)
  Follow up Call-  Call back number 11/09/2021  Post procedure Call Back phone  # [phone]  Permission to leave phone message Yes  Some recent data might be hidden     F/u call attempted, no answer, left msg

## 2021-11-13 NOTE — Telephone Encounter (Signed)
Second post procedure follow up call, no answer 

## 2021-11-14 ENCOUNTER — Encounter: Payer: Self-pay | Admitting: Internal Medicine

## 2021-12-21 ENCOUNTER — Other Ambulatory Visit: Payer: Self-pay | Admitting: Family Medicine

## 2021-12-21 DIAGNOSIS — Z1231 Encounter for screening mammogram for malignant neoplasm of breast: Secondary | ICD-10-CM

## 2022-02-13 ENCOUNTER — Ambulatory Visit: Payer: BC Managed Care – PPO

## 2022-06-26 LAB — GLUCOSE, POCT (MANUAL RESULT ENTRY): POC Glucose: 118 mg/dl — AB (ref 70–99)

## 2023-08-27 ENCOUNTER — Other Ambulatory Visit: Payer: Self-pay | Admitting: Sports Medicine

## 2023-08-27 DIAGNOSIS — G8929 Other chronic pain: Secondary | ICD-10-CM

## 2023-08-27 DIAGNOSIS — M25511 Pain in right shoulder: Secondary | ICD-10-CM

## 2023-09-02 ENCOUNTER — Other Ambulatory Visit: Payer: Self-pay | Admitting: Sports Medicine

## 2023-09-04 ENCOUNTER — Other Ambulatory Visit: Payer: Self-pay | Admitting: Sports Medicine

## 2023-09-04 DIAGNOSIS — M25511 Pain in right shoulder: Secondary | ICD-10-CM

## 2023-10-10 ENCOUNTER — Ambulatory Visit
Admission: RE | Admit: 2023-10-10 | Discharge: 2023-10-10 | Disposition: A | Discharge status: Home / Self Care | Payer: BC Managed Care – PPO | Source: Ambulatory Visit | Attending: Sports Medicine | Admitting: Sports Medicine

## 2023-10-10 DIAGNOSIS — G8929 Other chronic pain: Secondary | ICD-10-CM

## 2023-10-10 DIAGNOSIS — M25511 Pain in right shoulder: Secondary | ICD-10-CM

## 2023-10-10 MED ORDER — IOPAMIDOL (ISOVUE-M 200) INJECTION 41%
10.0000 mL | Freq: Once | INTRAMUSCULAR | Status: AC
  Administered 2023-10-10: 10 mL via INTRA_ARTICULAR

## 2024-01-20 ENCOUNTER — Other Ambulatory Visit: Payer: Self-pay

## 2024-01-20 ENCOUNTER — Encounter (HOSPITAL_BASED_OUTPATIENT_CLINIC_OR_DEPARTMENT_OTHER): Payer: Self-pay | Admitting: Orthopedic Surgery

## 2024-01-20 NOTE — Progress Notes (Signed)

## 2024-01-26 NOTE — H&P (Signed)
 PREOPERATIVE H&P  Chief Complaint: Right shoulder pain  HPI: Valerie Butler is a 48 y.o. female who presents for preoperative history and physical with a diagnosis of right supraspinatus tear. Symptoms are rated as moderate to severe, and have been worsening.  This is significantly impairing activities of daily living.  She has elected for surgical management.  She has had pain for the last 6 to 8 months, failed injections anti-inflammatories exercises.  She does desk work.  Past Medical History:  Diagnosis Date   Anxiety    GERD (gastroesophageal reflux disease)    Hypothyroidism    Past Surgical History:  Procedure Laterality Date   CHOLECYSTECTOMY     TUBAL LIGATION  2011   Social History   Socioeconomic History   Marital status: Significant Other    Spouse name: Not on file   Number of children: 2   Years of education: Not on file   Highest education level: Not on file  Occupational History   Occupation: Production designer, theatre/television/film  Tobacco Use   Smoking status: Former    Types: Cigarettes   Smokeless tobacco: Never   Tobacco comments:    quit 20 yrs ago  Vaping Use   Vaping status: Never Used  Substance and Sexual Activity   Alcohol use: Not Currently    Alcohol/week: 1.0 standard drink of alcohol    Types: 1 Glasses of wine per week   Drug use: Never   Sexual activity: Not on file  Other Topics Concern   Not on file  Social History Narrative   Not on file   Social Drivers of Health   Financial Resource Strain: Not on file  Food Insecurity: Not on file  Transportation Needs: Not on file  Physical Activity: Not on file  Stress: Not on file  Social Connections: Unknown (07/01/2023)   Received from [facility]   Social Network    Social Network: Not on file   Family History  Problem Relation Age of Onset   Rectal cancer Father    Ovarian cancer Maternal Grandmother    Colon cancer Neg Hx    Esophageal cancer Neg Hx    Pancreatic cancer Neg Hx    Colon polyps Neg Hx     Stomach cancer Neg Hx    No Known Allergies Prior to Admission medications   Medication Sig Start Date End Date Taking? Authorizing Provider  levothyroxine (SYNTHROID) 125 MCG tablet Take 125 mcg by mouth daily before breakfast.   Yes [provider]  norgestimate-ethinyl estradiol (ORTHO-CYCLEN) 0.25-35 MG-MCG tablet Take 1 tablet by mouth daily.   Yes [provider]     Positive ROS: All other systems have been reviewed and were otherwise negative with the exception of those mentioned in the HPI and as above.  Physical Exam: General: Alert, no acute distress Cardiovascular: No pedal edema Respiratory: No cyanosis, no use of accessory musculature GI: No organomegaly, abdomen is soft and non-tender Skin: No lesions in the area of chief complaint Neurologic: Sensation intact distally Psychiatric: Patient is competent for consent with normal mood and affect Lymphatic: No axillary or cervical lymphadenopathy  MUSCULOSKELETAL: Right shoulder has 0 to 175 degrees with no pain over the [facility] joint, positive impingement signs and mid range weakness.  Assessment: Right shoulder supraspinatus tear   Plan: Plan for Procedure(s): SHOULDER ARTHROSCOPY WITH ROTATOR CUFF REPAIR AND SUBACROMIAL DECOMPRESSION ARTHROSCOPY SHOULDER  The risks benefits and alternatives were discussed with the patient including but not limited to the risks of  nonoperative treatment, versus surgical intervention including infection, bleeding, nerve injury,  blood clots, cardiopulmonary complications, morbidity, mortality, among others, and they were willing to proceed.  We also discussed risks for recurrent rotator cuff tear, incomplete relief of pain, among others.     Eulas Post, MD Cell [phone]   01/26/2024 4:30 PM

## 2024-01-27 ENCOUNTER — Encounter (HOSPITAL_BASED_OUTPATIENT_CLINIC_OR_DEPARTMENT_OTHER): Payer: Self-pay | Admitting: Orthopedic Surgery

## 2024-01-27 ENCOUNTER — Ambulatory Visit (HOSPITAL_BASED_OUTPATIENT_CLINIC_OR_DEPARTMENT_OTHER): Payer: BC Managed Care – PPO | Admitting: Anesthesiology

## 2024-01-27 ENCOUNTER — Encounter (HOSPITAL_BASED_OUTPATIENT_CLINIC_OR_DEPARTMENT_OTHER)
Admission: RE | Disposition: A | Discharge status: Home / Self Care | Payer: Self-pay | Source: Home / Self Care | Attending: Orthopedic Surgery

## 2024-01-27 ENCOUNTER — Ambulatory Visit (HOSPITAL_BASED_OUTPATIENT_CLINIC_OR_DEPARTMENT_OTHER)
Admission: RE | Admit: 2024-01-27 | Discharge: 2024-01-27 | Disposition: A | Discharge status: Home / Self Care | Payer: BC Managed Care – PPO | Attending: Orthopedic Surgery | Admitting: Orthopedic Surgery

## 2024-01-27 DIAGNOSIS — Z01818 Encounter for other preprocedural examination: Secondary | ICD-10-CM

## 2024-01-27 DIAGNOSIS — M25511 Pain in right shoulder: Secondary | ICD-10-CM | POA: Diagnosis present

## 2024-01-27 DIAGNOSIS — S43431A Superior glenoid labrum lesion of right shoulder, initial encounter: Secondary | ICD-10-CM | POA: Diagnosis not present

## 2024-01-27 DIAGNOSIS — K219 Gastro-esophageal reflux disease without esophagitis: Secondary | ICD-10-CM | POA: Insufficient documentation

## 2024-01-27 DIAGNOSIS — X58XXXA Exposure to other specified factors, initial encounter: Secondary | ICD-10-CM | POA: Insufficient documentation

## 2024-01-27 DIAGNOSIS — M25811 Other specified joint disorders, right shoulder: Secondary | ICD-10-CM | POA: Diagnosis not present

## 2024-01-27 DIAGNOSIS — M75121 Complete rotator cuff tear or rupture of right shoulder, not specified as traumatic: Secondary | ICD-10-CM | POA: Insufficient documentation

## 2024-01-27 DIAGNOSIS — Z87891 Personal history of nicotine dependence: Secondary | ICD-10-CM | POA: Insufficient documentation

## 2024-01-27 HISTORY — DX: Hypothyroidism, unspecified: E03.9

## 2024-01-27 HISTORY — PX: SHOULDER ARTHROSCOPY: SHX128

## 2024-01-27 HISTORY — DX: Anxiety disorder, unspecified: F41.9

## 2024-01-27 HISTORY — PX: SHOULDER ARTHROSCOPY WITH ROTATOR CUFF REPAIR AND SUBACROMIAL DECOMPRESSION: SHX5686

## 2024-01-27 LAB — POCT PREGNANCY, URINE: Preg Test, Ur: NEGATIVE

## 2024-01-27 SURGERY — SHOULDER ARTHROSCOPY WITH ROTATOR CUFF REPAIR AND SUBACROMIAL DECOMPRESSION
Anesthesia: General | Site: Shoulder | Laterality: Right

## 2024-01-27 MED ORDER — LIDOCAINE 2% (20 MG/ML) 5 ML SYRINGE
INTRAMUSCULAR | Status: AC
  Filled 2024-01-27: qty 5

## 2024-01-27 MED ORDER — FENTANYL CITRATE (PF) 100 MCG/2ML IJ SOLN
INTRAMUSCULAR | Status: AC
  Filled 2024-01-27: qty 2

## 2024-01-27 MED ORDER — ONDANSETRON HCL 4 MG/2ML IJ SOLN: 4.0000 mg | Freq: Four times a day (QID) | INTRAMUSCULAR | Status: DC | PRN

## 2024-01-27 MED ORDER — PROPOFOL 10 MG/ML IV BOLUS
INTRAVENOUS | Status: AC
  Filled 2024-01-27: qty 20

## 2024-01-27 MED ORDER — OXYCODONE HCL 5 MG PO TABS: 5.0000 mg | ORAL_TABLET | Freq: Once | ORAL | Status: DC | PRN

## 2024-01-27 MED ORDER — POVIDONE-IODINE 10 % EX SWAB
2.0000 | Freq: Once | CUTANEOUS | Status: DC
Start: 2024-01-27 — End: 2024-01-27

## 2024-01-27 MED ORDER — BUPIVACAINE HCL (PF) 0.5 % IJ SOLN
INTRAMUSCULAR | Status: DC | PRN
  Administered 2024-01-27: 15 mL via PERINEURAL

## 2024-01-27 MED ORDER — OXYCODONE HCL 5 MG PO TABS: 5.0000 mg | ORAL_TABLET | ORAL | 0 refills | Status: AC | PRN

## 2024-01-27 MED ORDER — ONDANSETRON HCL 4 MG PO TABS: 4.0000 mg | ORAL_TABLET | Freq: Three times a day (TID) | ORAL | 0 refills | Status: AC | PRN

## 2024-01-27 MED ORDER — FENTANYL CITRATE (PF) 100 MCG/2ML IJ SOLN: 25.0000 ug | INTRAMUSCULAR | Status: DC | PRN

## 2024-01-27 MED ORDER — DEXAMETHASONE SODIUM PHOSPHATE 10 MG/ML IJ SOLN
INTRAMUSCULAR | Status: AC
  Filled 2024-01-27: qty 1

## 2024-01-27 MED ORDER — FENTANYL CITRATE (PF) 100 MCG/2ML IJ SOLN
50.0000 ug | Freq: Once | INTRAMUSCULAR | Status: AC
  Administered 2024-01-27: 50 ug via INTRAVENOUS

## 2024-01-27 MED ORDER — CEFAZOLIN SODIUM-DEXTROSE 2-4 GM/100ML-% IV SOLN
INTRAVENOUS | Status: AC
  Filled 2024-01-27: qty 100

## 2024-01-27 MED ORDER — ACETAMINOPHEN 500 MG PO TABS
ORAL_TABLET | ORAL | Status: AC
  Filled 2024-01-27: qty 2

## 2024-01-27 MED ORDER — LACTATED RINGERS IV SOLN: INTRAVENOUS | Status: DC

## 2024-01-27 MED ORDER — ONDANSETRON HCL 4 MG/2ML IJ SOLN
INTRAMUSCULAR | Status: AC
  Filled 2024-01-27: qty 2

## 2024-01-27 MED ORDER — MIDAZOLAM HCL 2 MG/2ML IJ SOLN
INTRAMUSCULAR | Status: AC
  Filled 2024-01-27: qty 2

## 2024-01-27 MED ORDER — AMISULPRIDE (ANTIEMETIC) 5 MG/2ML IV SOLN
10.0000 mg | Freq: Once | INTRAVENOUS | Status: AC
  Administered 2024-01-27: 10 mg via INTRAVENOUS

## 2024-01-27 MED ORDER — ACETAMINOPHEN 500 MG PO TABS
1000.0000 mg | ORAL_TABLET | Freq: Once | ORAL | Status: AC
  Administered 2024-01-27: 1000 mg via ORAL

## 2024-01-27 MED ORDER — MIDAZOLAM HCL 2 MG/2ML IJ SOLN
1.0000 mg | Freq: Once | INTRAMUSCULAR | Status: AC
  Administered 2024-01-27: 1 mg via INTRAVENOUS

## 2024-01-27 MED ORDER — SUGAMMADEX SODIUM 200 MG/2ML IV SOLN
INTRAVENOUS | Status: DC | PRN
  Administered 2024-01-27: 200 mg via INTRAVENOUS

## 2024-01-27 MED ORDER — ROCURONIUM BROMIDE 10 MG/ML (PF) SYRINGE
PREFILLED_SYRINGE | INTRAVENOUS | Status: DC | PRN
  Administered 2024-01-27: 10 mg via INTRAVENOUS
  Administered 2024-01-27: 50 mg via INTRAVENOUS

## 2024-01-27 MED ORDER — DEXAMETHASONE SODIUM PHOSPHATE 10 MG/ML IJ SOLN
INTRAMUSCULAR | Status: DC | PRN
  Administered 2024-01-27: 5 mg via INTRAVENOUS

## 2024-01-27 MED ORDER — LIDOCAINE 2% (20 MG/ML) 5 ML SYRINGE
INTRAMUSCULAR | Status: DC | PRN
  Administered 2024-01-27: 60 mg via INTRAVENOUS

## 2024-01-27 MED ORDER — SENNA-DOCUSATE SODIUM 8.6-50 MG PO TABS: 2.0000 | ORAL_TABLET | Freq: Every day | ORAL | 1 refills | Status: AC

## 2024-01-27 MED ORDER — FENTANYL CITRATE (PF) 100 MCG/2ML IJ SOLN
INTRAMUSCULAR | Status: DC | PRN
  Administered 2024-01-27: 50 ug via INTRAVENOUS

## 2024-01-27 MED ORDER — BUPIVACAINE LIPOSOME 1.3 % IJ SUSP
INTRAMUSCULAR | Status: DC | PRN
Start: 2024-01-27 — End: 2024-01-27
  Administered 2024-01-27: 10 mL via PERINEURAL

## 2024-01-27 MED ORDER — AMISULPRIDE (ANTIEMETIC) 5 MG/2ML IV SOLN
INTRAVENOUS | Status: AC
  Filled 2024-01-27: qty 2

## 2024-01-27 MED ORDER — ONDANSETRON HCL 4 MG/2ML IJ SOLN
INTRAMUSCULAR | Status: DC | PRN
  Administered 2024-01-27: 4 mg via INTRAVENOUS

## 2024-01-27 MED ORDER — BACLOFEN 10 MG PO TABS
10.0000 mg | ORAL_TABLET | Freq: Three times a day (TID) | ORAL | 0 refills | Status: AC
Start: 2024-01-27 — End: ?

## 2024-01-27 MED ORDER — ROCURONIUM BROMIDE 10 MG/ML (PF) SYRINGE
PREFILLED_SYRINGE | INTRAVENOUS | Status: AC
  Filled 2024-01-27: qty 10

## 2024-01-27 MED ORDER — CEFAZOLIN SODIUM-DEXTROSE 2-4 GM/100ML-% IV SOLN
2.0000 g | INTRAVENOUS | Status: AC
  Administered 2024-01-27: 2 g via INTRAVENOUS

## 2024-01-27 MED ORDER — PROPOFOL 10 MG/ML IV BOLUS
INTRAVENOUS | Status: DC | PRN
  Administered 2024-01-27: 150 mg via INTRAVENOUS

## 2024-01-27 MED ORDER — OXYCODONE HCL 5 MG/5ML PO SOLN: 5.0000 mg | Freq: Once | ORAL | Status: DC | PRN

## 2024-01-27 SURGICAL SUPPLY — 49 items
ANCHOR SUT BIO SW 4.75X19.1 (Anchor) IMPLANT
BLADE EXCALIBUR 4.0X13 (MISCELLANEOUS) IMPLANT
BURR OVAL 8 FLU 4.0X13 (MISCELLANEOUS) ×1 IMPLANT
BURR OVAL 8 FLU 5.0X13 (MISCELLANEOUS) IMPLANT
CANNULA 5.75X71 LONG (CANNULA) ×1 IMPLANT
CANNULA TWIST IN 8.25X7CM (CANNULA) IMPLANT
CLSR STERI-STRIP ANTIMIC 1/2X4 (GAUZE/BANDAGES/DRESSINGS) ×1 IMPLANT
COOLER ICEMAN CLASSIC (MISCELLANEOUS) IMPLANT
DISSECTOR 3.8MM X 13CM (MISCELLANEOUS) ×1 IMPLANT
DRAPE IMP U-DRAPE 54X76 (DRAPES) ×1 IMPLANT
DRAPE INCISE IOBAN 66X45 STRL (DRAPES) ×1 IMPLANT
DRAPE POUCH INSTRU U-SHP 10X18 (DRAPES) ×1 IMPLANT
DRAPE SHOULDER BEACH CHAIR (DRAPES) ×1 IMPLANT
DRAPE SURG 17X23 STRL (DRAPES) ×1 IMPLANT
DRAPE U-SHAPE 47X51 STRL (DRAPES) ×1 IMPLANT
DURAPREP 26ML APPLICATOR (WOUND CARE) ×1 IMPLANT
FIBERSTICK 2 (SUTURE) IMPLANT
GAUZE PAD ABD 8X10 STRL (GAUZE/BANDAGES/DRESSINGS) ×1 IMPLANT
GAUZE SPONGE 4X4 12PLY STRL (GAUZE/BANDAGES/DRESSINGS) ×1 IMPLANT
GLOVE BIO SURGEON STRL SZ7 (GLOVE) ×1 IMPLANT
GLOVE BIOGEL PI IND STRL 7.0 (GLOVE) ×1 IMPLANT
GLOVE BIOGEL PI IND STRL 8 (GLOVE) ×2 IMPLANT
GLOVE ORTHO TXT STRL SZ7.5 (GLOVE) ×1 IMPLANT
GOWN STRL REUS W/ TWL LRG LVL3 (GOWN DISPOSABLE) ×2 IMPLANT
GOWN STRL REUS W/ TWL XL LVL3 (GOWN DISPOSABLE) ×2 IMPLANT
IV NS IRRIG 3000ML ARTHROMATIC (IV SOLUTION) ×3 IMPLANT
LASSO 90 CVE QUICKPAS (DISPOSABLE) IMPLANT
MANIFOLD NEPTUNE II (INSTRUMENTS) ×1 IMPLANT
NDL HD SCORPION MEGA LOADER (NEEDLE) IMPLANT
PACK ARTHROSCOPY DSU (CUSTOM PROCEDURE TRAY) ×1 IMPLANT
PACK BASIN DAY SURGERY FS (CUSTOM PROCEDURE TRAY) ×1 IMPLANT
RESTRAINT HEAD UNIVERSAL NS (MISCELLANEOUS) ×1 IMPLANT
SHEET MEDIUM DRAPE 40X70 STRL (DRAPES) ×1 IMPLANT
SLEEVE SCD COMPRESS KNEE MED (STOCKING) ×1 IMPLANT
SLING ARM FOAM STRAP LRG (SOFTGOODS) IMPLANT
SPIKE FLUID TRANSFER (MISCELLANEOUS) IMPLANT
SUPPORT WRAP ARM LG (MISCELLANEOUS) ×1 IMPLANT
SUT FIBERWIRE #2 38 T-5 BLUE (SUTURE) IMPLANT
SUT MNCRL AB 4-0 PS2 18 (SUTURE) ×1 IMPLANT
SUT PDS AB 1 CT 36 (SUTURE) IMPLANT
SUT TIGER TAPE 7 IN WHITE (SUTURE) IMPLANT
SUT VIC AB 3-0 SH 27X BRD (SUTURE) IMPLANT
SUTURE FIBERWR #2 38 T-5 BLUE (SUTURE) IMPLANT
TAPE FIBER 2MM 7IN #2 BLUE (SUTURE) IMPLANT
TOWEL GREEN STERILE FF (TOWEL DISPOSABLE) ×1 IMPLANT
TUBE CONNECTING 20X1/4 (TUBING) ×1 IMPLANT
TUBING ARTHROSCOPY IRRIG 16FT (MISCELLANEOUS) ×1 IMPLANT
WAND ABLATOR APOLLO I90 (BUR) ×1 IMPLANT
WATER STERILE IRR 1000ML POUR (IV SOLUTION) ×1 IMPLANT

## 2024-01-27 NOTE — Anesthesia Preprocedure Evaluation (Signed)
 Anesthesia Evaluation  Patient identified by MRN, date of birth, ID band Patient awake    Reviewed: Allergy & Precautions, H&P , NPO status , Patient's Chart, lab work & pertinent test results  Airway Mallampati: II   Neck ROM: full    Dental   Pulmonary former smoker   breath sounds clear to auscultation       Cardiovascular negative cardio ROS  Rhythm:regular Rate:Normal     Neuro/Psych    GI/Hepatic ,GERD  ,,  Endo/Other  Hypothyroidism    Renal/GU      Musculoskeletal   Abdominal   Peds  Hematology   Anesthesia Other Findings   Reproductive/Obstetrics                             Anesthesia Physical Anesthesia Plan  ASA: 2  Anesthesia Plan: General   Post-op Pain Management: Regional block*   Induction: Intravenous  PONV Risk Score and Plan: 3 and Ondansetron, Dexamethasone, Midazolam and Treatment may vary due to age or medical condition  Airway Management Planned: Oral ETT  Additional Equipment:   Intra-op Plan:   Post-operative Plan: Extubation in OR  Informed Consent: I have reviewed the patients History and Physical, chart, labs and discussed the procedure including the risks, benefits and alternatives for the proposed anesthesia with the patient or authorized representative who has indicated his/her understanding and acceptance.     Dental advisory given  Plan Discussed with: CRNA, Anesthesiologist and Surgeon  Anesthesia Plan Comments:        Anesthesia Quick Evaluation

## 2024-01-27 NOTE — Transfer of Care (Signed)
 Immediate Anesthesia Transfer of Care Note  Patient: Valerie Butler  Procedure(s) Performed: Procedure(s) (LRB): SHOULDER ARTHROSCOPY WITH ROTATOR CUFF REPAIR AND SUBACROMIAL DECOMPRESSION (Right) ARTHROSCOPY SHOULDER (Right)  Patient Location: PACU  Anesthesia Type: GA  Level of Consciousness: awake, sedated, patient cooperative and responds to stimulation, c/o pain in back - comfort measures given w/ medication   Airway & Oxygen Therapy: Patient Spontanous Breathing and Patient connected to [place] oxygen  Post-op Assessment: Report given to PACU RN, Post -op Vital signs reviewed and stable and Patient moving all extremities  Post vital signs: Reviewed and stable  Complications: No apparent anesthesia complications

## 2024-01-27 NOTE — Op Note (Signed)
 01/27/2024  11:31 AM  PATIENT:  Valerie Butler    DIAGNOSES: Right shoulder, acute on chronic rotator cuff tear, SLAP tear, and subacromial impingement.  POST-OPERATIVE DIAGNOSIS: same  PROCEDURE: Arthroscopic limited debridement - 16109 Arthroscopic subacromial decompression - 60454 Arthroscopic rotator cuff repair - 09811   OPERATIVE FINDING: Exam under anesthesia: Normal Articular space: Normal Chondral surfaces: Normal Biceps: Normal Subscapularis: Intact  Supraspinatus: Complete tear this tear had an interesting configuration, with basically a hold that was anterior just posterior to the biceps, and it was almost a tendon to tendon tear, most of the tendon was still attached to the bone. Infraspinatus: Intact   SURGEON:  Eulas Post, MD  PHYSICIAN ASSISTANT: Dan Humphreys, PA-C, present and scrubbed throughout the case, critical for completion in a timely fashion, and for retraction, instrumentation, and closure.  ANESTHESIA:   General with regional block  PREOPERATIVE INDICATIONS:  [name] is a  48 y.o. female with significant shoulder pain who failed conservative measures and elected for surgical management.    The risks benefits and alternatives were discussed with the patient preoperatively including but not limited to the risks of infection, bleeding, nerve injury, cardiopulmonary complications, the need for revision surgery, among others, and the patient was willing to proceed.  ESTIMATED BLOOD LOSS: Minimal  OPERATIVE IMPLANTS: Arthrex Biocomposite SwiveLock 4.75 x 2, 1 for the medial row, and I used the #2 FiberWire in a margin convergence type fashion bringing the tendon from side-to-side together and then tied down to the medial row, and then took all 4 remaining suture limbs, including 2 from the tie, and brought them into a lateral row.  I had 1 preloaded set of fiber tapes and the medial anchor.  I used a second swivel lock for the lateral  row.  OPERATIVE FINDINGS: She had full motion during examination under anesthesia.  She had a longitudinal tear in the supraspinatus at the anterior margin which was effectively a tendon to tendon tear.   OPERATIVE PROCEDURE: The patient was brought to the operating room and placed in the supine position.  General anesthesia was administered and the patient positioned in a beach chair position.  IV antibiotics were given.  The upper extremity was prepped and draped in the usual sterile fashion.  Timeout performed.  Diagnostic arthroscopy was carried out with the above named findings.    The glenoid labrum was debrided anteriorly and superiorly.    I debrided the undersurface of the cuff.  I went to the subacromial space, performed a complete bursectomy, subacromial CA ligament release, with a acromioplasty.  I evaluated the tear and prepared the tendon for reinsertion.  I cleared a space at the medial margin, and then performed a light tuboplasty, and then placed the medial anchor preloaded with fiber tape.  I passed the #2 FiberWire in the anterolateral and posterolateral position, and used a #2 fiber tape into the anteromedial and posterior medial positions.  I tied the #2 FiberWire together which had a nice margin convergence effect, and brought the repair to near completion, but then I augmented the fixation by taking all of the sutures into a lateral row taking care not to over tighten the fiber tapes.  Excellent fixation and reduction of the tendon was achieved.  I touched up the acromioplasty viewing from lateral portal.  The instruments were removed, the portals closed with Monocryl followed by Steri-Strips and sterile gauze.  The patient was awakened and returned to the PACU in stable and  satisfactory condition.  There were no complications and She tolerated the procedure well.

## 2024-01-27 NOTE — Anesthesia Procedure Notes (Signed)
 Anesthesia Regional Block: Interscalene brachial plexus block   Pre-Anesthetic Checklist: , timeout performed,  Correct Patient, Correct Site, Correct Laterality,  Correct Procedure, Correct Position, site marked,  Risks and benefits discussed,  Surgical consent,  Pre-op evaluation,  At surgeon's request and post-op pain management  Laterality: Right  Prep: chloraprep       Needles:  Injection technique: Single-shot  Needle Type: Echogenic Stimulator Needle     Needle Length: 5cm  Needle Gauge: 22     Additional Needles:   Procedures:, nerve stimulator,,,,,     Nerve Stimulator or Paresthesia:  Response: biceps flexion, 0.45 mA  Additional Responses:   Narrative:  Start time: 01/27/2024 9:20 AM End time: 01/27/2024 9:26 AM Injection made incrementally with aspirations every 5 mL.  Performed by: Personally  Anesthesiologist: Achille Rich, MD  Additional Notes: Functioning IV was confirmed and monitors were applied.  A 50mm 22ga Arrow echogenic stimulator needle was used. Sterile prep and drape,hand hygiene and sterile gloves were used.  Negative aspiration and negative test dose prior to incremental administration of local anesthetic. The patient tolerated the procedure well.  Ultrasound guidance: relevent anatomy identified, needle position confirmed, local anesthetic spread visualized around nerve(s), vascular puncture avoided.  Image printed for medical record.

## 2024-01-27 NOTE — Discharge Instructions (Addendum)
 Diet: As you were doing prior to hospitalization   Shower:  May shower but keep the wounds dry, use an occlusive plastic wrap, NO SOAKING IN TUB.  If the bandage gets wet, change with a clean dry gauze.  If you have a splint on, leave the splint in place and keep the splint dry with a plastic bag.  Dressing:  You may change your dressing 3-5 days after surgery, unless you have a splint.  If you have a splint, then just leave the splint in place and we will change your bandages during your first follow-up appointment.    If you had hand or foot surgery, we will plan to remove your stitches in about 2 weeks in the office.  For all other surgeries, there are sticky tapes (steri-strips) on your wounds and all the stitches are absorbable.  Leave the steri-strips in place when changing your dressings, they will peel off with time, usually 2-3 weeks.  Activity:  Increase activity slowly as tolerated, but follow the weight bearing instructions below.  The rules on driving is that you can not be taking narcotics while you drive, and you must feel in control of the vehicle.    Weight Bearing:   no lifting with right arm.    To prevent constipation: you may use a stool softener such as -  Colace (over the counter) 100 mg by mouth twice a day  Drink plenty of fluids (prune juice may be helpful) and high fiber foods Miralax (over the counter) for constipation as needed.    Itching:  If you experience itching with your medications, try taking only a single pain pill, or even half a pain pill at a time.  You may take up to 10 pain pills per day, and you can also use benadryl over the counter for itching or also to help with sleep.   Precautions:  If you experience chest pain or shortness of breath - call 911 immediately for transfer to the hospital emergency department!!  If you develop a fever greater that 101 F, purulent drainage from wound, increased redness or drainage from wound, or calf pain -- Call the  office at [phone]                                                Follow- Up Appointment:  Please call for an appointment to be seen in 2 weeks [place] - [phone]      Post Anesthesia Home Care Instructions  Activity: Get plenty of rest for the remainder of the day. A responsible individual must stay with you for 24 hours following the procedure.  For the next 24 hours, DO NOT: -Drive a car -Advertising copywriter -Drink alcoholic beverages -Take any medication unless instructed by your physician -Make any legal decisions or sign important papers.  Meals: Start with liquid foods such as gelatin or soup. Progress to regular foods as tolerated. Avoid greasy, spicy, heavy foods. If nausea and/or vomiting occur, drink only clear liquids until the nausea and/or vomiting subsides. Call your physician if vomiting continues.  Special Instructions/Symptoms: Your throat may feel dry or sore from the anesthesia or the breathing tube placed in your throat during surgery. If this causes discomfort, gargle with warm salt water. The discomfort should disappear within 24 hours.  If you had a scopolamine patch placed behind your ear  for the management of post- operative nausea and/or vomiting:  1. The medication in the patch is effective for 72 hours, after which it should be removed.  Wrap patch in a tissue and discard in the trash. Wash hands thoroughly with soap and water. 2. You may remove the patch earlier than 72 hours if you experience unpleasant side effects which may include dry mouth, dizziness or visual disturbances. 3. Avoid touching the patch. Wash your hands with soap and water after contact with the patch.    Regional Anesthesia Blocks  1. You may not be able to move or feel the "blocked" extremity after a regional anesthetic block. This may last may last from 3-48 hours after placement, but it will go away. The length of time depends on the medication injected and your  individual response to the medication. As the nerves start to wake up, you may experience tingling as the movement and feeling returns to your extremity. If the numbness and inability to move your extremity has not gone away after 48 hours, please call your surgeon.   2. The extremity that is blocked will need to be protected until the numbness is gone and the strength has returned. Because you cannot feel it, you will need to take extra care to avoid injury. Because it may be weak, you may have difficulty moving it or using it. You may not know what position it is in without looking at it while the block is in effect.  3. For blocks in the legs and feet, returning to weight bearing and walking needs to be done carefully. You will need to wait until the numbness is entirely gone and the strength has returned. You should be able to move your leg and foot normally before you try and bear weight or walk. You will need someone to be with you when you first try to ensure you do not fall and possibly risk injury.  4. Bruising and tenderness at the needle site are common side effects and will resolve in a few days.  5. Persistent numbness or new problems with movement should be communicated to the surgeon or the [facility] [phone] [facility] [phone]).  Information for Discharge Teaching: EXPAREL (bupivacaine liposome injectable suspension)   Pain relief is important to your recovery. The goal is to control your pain so you can move easier and return to your normal activities as soon as possible after your procedure. Your physician may use several types of medicines to manage pain, swelling, and more.  Your surgeon or anesthesiologist gave you EXPAREL(bupivacaine) to help control your pain after surgery.  EXPAREL is a local anesthetic designed to release slowly over an extended period of time to provide pain relief by numbing the tissue around the surgical  site. EXPAREL is designed to release pain medication over time and can control pain for up to 72 hours. Depending on how you respond to EXPAREL, you may require less pain medication during your recovery. EXPAREL can help reduce or eliminate the need for opioids during the first few days after surgery when pain relief is needed the most. EXPAREL is not an opioid and is not addictive. It does not cause sleepiness or sedation.   Important! A teal colored band has been placed on your arm with the date, time and amount of EXPAREL you have received. Please leave this armband in place for the full 96 hours following administration, and then you may remove the band. If  you return to the hospital for any reason within 96 hours following the administration of EXPAREL, the armband provides important information that your health care providers to know, and alerts them that you have received this anesthetic.    Possible side effects of EXPAREL: Temporary loss of sensation or ability to move in the area where medication was injected. Nausea, vomiting, constipation Rarely, numbness and tingling in your mouth or lips, lightheadedness, or anxiety may occur. Call your doctor right away if you think you may be experiencing any of these sensations, or if you have other questions regarding possible side effects.  Follow all other discharge instructions given to you by your surgeon or nurse. Eat a healthy diet and drink plenty of water or other fluids.  Next tylenol dose if needed at 2:45pm

## 2024-01-27 NOTE — Anesthesia Procedure Notes (Signed)
 Procedure Name: Intubation Date/Time: 01/27/2024 10:13 AM  Performed by: Yolanda Bonine, CRNAPre-anesthesia Checklist: Patient identified, Emergency Drugs available, Suction available and Patient being monitored Patient Re-evaluated:Patient Re-evaluated prior to induction Oxygen Delivery Method: Circle system utilized Preoxygenation: Pre-oxygenation with 100% oxygen Induction Type: IV induction Ventilation: Mask ventilation without difficulty Laryngoscope Size: Mac and 3 Grade View: Grade I Tube type: Oral Laser Tube: Cuffed inflated with minimal occlusive pressure - saline Tube size: 7.0 mm Number of attempts: 1 Airway Equipment and Method: Stylet and Oral airway Placement Confirmation: ETT inserted through vocal cords under direct vision, positive ETCO2 and breath sounds checked- equal and bilateral Tube secured with: Tape Dental Injury: Teeth and Oropharynx as per pre-operative assessment

## 2024-01-27 NOTE — Interval H&P Note (Signed)
 History and Physical Interval Note:  01/27/2024 9:41 AM  Valerie Butler  has presented today for surgery, with the diagnosis of RIGHT SHOULDER CARTILAGE DISORDER, IMPINGEMENT SYNDROME, ROTATOR CUFF TEAR.  The various methods of treatment have been discussed with the patient and family. After consideration of risks, benefits and other options for treatment, the patient has consented to  Procedure(s): SHOULDER ARTHROSCOPY WITH ROTATOR CUFF REPAIR AND SUBACROMIAL DECOMPRESSION (Right) ARTHROSCOPY SHOULDER (Right) as a surgical intervention.  The patient's history has been reviewed, patient examined, no change in status, stable for surgery.  I have reviewed the patient's chart and labs.  Questions were answered to the patient's satisfaction.     Eulas Post

## 2024-01-27 NOTE — Progress Notes (Signed)
Assisted Dr. Chaney Malling with right, interscalene , ultrasound guided block. Side rails up, monitors on throughout procedure. See vital signs in flow sheet. Tolerated Procedure well.

## 2024-01-28 ENCOUNTER — Encounter (HOSPITAL_BASED_OUTPATIENT_CLINIC_OR_DEPARTMENT_OTHER): Payer: Self-pay | Admitting: Orthopedic Surgery

## 2024-01-30 NOTE — Anesthesia Postprocedure Evaluation (Signed)
 Anesthesia Post Note  Patient: Valerie Butler  Procedure(s) Performed: SHOULDER ARTHROSCOPY WITH ROTATOR CUFF REPAIR AND SUBACROMIAL DECOMPRESSION (Right: Shoulder) ARTHROSCOPY SHOULDER (Right: Shoulder)     Patient location during evaluation: PACU Anesthesia Type: General and Regional Level of consciousness: awake and alert Pain management: pain level controlled Vital Signs Assessment: post-procedure vital signs reviewed and stable Respiratory status: spontaneous breathing, nonlabored ventilation, respiratory function stable and patient connected to nasal cannula oxygen Cardiovascular status: blood pressure returned to baseline and stable Postop Assessment: no apparent nausea or vomiting Anesthetic complications: no   No notable events documented.  Last Vitals:  Vitals:   01/27/24 1300 01/27/24 1315  BP: 108/68 111/67  Pulse: 61 61  Resp: 16 14  Temp:  (!) 36.1 C  SpO2: 93% 96%    Last Pain:  Vitals:   01/28/24 0924  TempSrc:   PainSc: 4                  Nadeem Romanoski S
